# Patient Record
Sex: Female | Born: 1985 | Hispanic: Yes | Marital: Single | Smoking: Never smoker
Health system: Southern US, Community
[De-identification: ages and names within clinical notes are randomized; demographics above are authoritative.]

## PROBLEM LIST (undated history)

## (undated) ENCOUNTER — Inpatient Hospital Stay (HOSPITAL_COMMUNITY): Payer: Self-pay

## (undated) DIAGNOSIS — Z789 Other specified health status: Secondary | ICD-10-CM

## (undated) HISTORY — PX: OTHER SURGICAL HISTORY: SHX169

## (undated) HISTORY — DX: Other specified health status: Z78.9

---

## 2017-10-22 LAB — HM PAP SMEAR: HM Pap smear: NEGATIVE

## 2018-10-27 ENCOUNTER — Ambulatory Visit (LOCAL_COMMUNITY_HEALTH_CENTER): Payer: Self-pay | Admitting: Nurse Practitioner

## 2018-10-27 ENCOUNTER — Other Ambulatory Visit: Payer: Self-pay

## 2018-10-27 VITALS — BP 119/80 | Ht 59.0 in | Wt 157.4 lb

## 2018-10-27 DIAGNOSIS — Z3009 Encounter for other general counseling and advice on contraception: Secondary | ICD-10-CM

## 2018-10-27 MED ORDER — CRYSELLE-28 0.3-30 MG-MCG PO TABS: 1.0000 | ORAL_TABLET | Freq: Every day | ORAL | 11 refills | Status: DC

## 2018-10-27 NOTE — Progress Notes (Signed)
Patient ID: Maureen Johnston, female   DOB: 11-17-1985, 33 y.o.   MRN: [id number]  Client into clinic requesting oral contraceptive pills for BCM. Denies any significant changes in past medical history over past year. Denies any additional significant medical concerns at this time.  Other This is a new problem. Nothing aggravates the symptoms.    Review of Systems  All other systems reviewed and are negative.   Physical Exam  Constitutional: She is oriented to person, place, and time and well-developed, well-nourished, and in no distress. Vital signs are normal.  HENT:  Head: Normocephalic.  Neck: Normal range of motion.  Cardiovascular: Normal rate and regular rhythm.  Pulmonary/Chest: Effort normal and breath sounds normal.  Genitourinary:    Genitourinary Comments: Deferred until later date   Musculoskeletal: Normal range of motion.  Lymphadenopathy:    She has no cervical adenopathy.  Neurological: She is alert and oriented to person, place, and time.  Skin: Skin is warm, dry and intact.  Psychiatric: Affect normal.  Nursing note and vitals reviewed.     Client into clinic desiring to change BCM to OCP's  Last yearly physical exam 2019 - denies any significant medical changes in last year  LMP - 10/18/2018 - normal Last sex 09/29/2018 with condoms  Discussed with client how to properly take OCP's and what to do if pills are missed. Please give Cryselle - take 1 tab po q daly at the same time, #6, RF#7  Advised client to RTC in  6 month for re evaluation/yearly physical exam.  Client  verbalizes understanding and is in agreement with plan of care

## 2018-10-27 NOTE — Progress Notes (Signed)
Desires to change from Depo (last received 06/03/2018) to ocps. Per verbal order of Jerline Pain FNP-BC, dispense Cryselle #6 packs with refill of #7 packs. Take one tablet QD at same time QD. [facility] consent signed and client counseled in how to take ocps.

## 2018-10-29 ENCOUNTER — Encounter: Payer: Self-pay | Admitting: Nurse Practitioner

## 2018-11-03 ENCOUNTER — Encounter: Payer: Self-pay | Admitting: Nurse Practitioner

## 2018-11-03 NOTE — Patient Instructions (Signed)
The current method of family planning is OCP (estrogen/progesterone).

## 2019-10-20 ENCOUNTER — Ambulatory Visit (LOCAL_COMMUNITY_HEALTH_CENTER): Payer: Self-pay | Admitting: Family Medicine

## 2019-10-20 ENCOUNTER — Encounter: Payer: Self-pay | Admitting: Family Medicine

## 2019-10-20 ENCOUNTER — Other Ambulatory Visit: Payer: Self-pay

## 2019-10-20 VITALS — BP 118/82 | Ht <= 58 in | Wt 149.0 lb

## 2019-10-20 DIAGNOSIS — Z3200 Encounter for pregnancy test, result unknown: Secondary | ICD-10-CM

## 2019-10-20 DIAGNOSIS — F32A Depression, unspecified: Secondary | ICD-10-CM

## 2019-10-20 DIAGNOSIS — Z3009 Encounter for other general counseling and advice on contraception: Secondary | ICD-10-CM

## 2019-10-20 DIAGNOSIS — F329 Major depressive disorder, single episode, unspecified: Secondary | ICD-10-CM

## 2019-10-20 LAB — PREGNANCY, URINE: Preg Test, Ur: NEGATIVE

## 2019-10-20 MED ORDER — THERA VITAL M PO TABS: 1.0000 | ORAL_TABLET | Freq: Every day | ORAL | 0 refills | Status: DC

## 2019-10-20 NOTE — Progress Notes (Signed)
Pt here for physical. Pt reports she is not interested in birth control today. Pt reports last period was 09/03/2019, but it was lighter than normal with brown spotting. Larene Pickett, FNP made aware of pt's PHQ9 score today.

## 2019-10-20 NOTE — Progress Notes (Signed)
UPT is negative today. Pt reports she plans to use condom for birth control. Pt aware that if she changes her mind and desires another type of BCM that she can give [place] a call and RTC at any time. Pt states understanding. Pt received Kathreen Cosier, LCSW and cardinal cards with contact info per pt request and provider order. PCP list given to pt per provider order. Pt received MVI's per pt request. Counseled pt per provider orders and pt states understanding. Provider orders completed.

## 2019-10-20 NOTE — Progress Notes (Signed)
Family Planning Visit- Repeat Yearly Visit  Subjective:  Maureen Johnston is a 34 y.o. [phone]  being seen today for an well woman visit and to discuss family planning options.    She is currently using condoms sometimes for pregnancy prevention. Patient reports she does not if she or her partner wants a pregnancy in the next year. Patient  does not have a problem list on file.  Chief Complaint  Patient presents with  . Gynecologic Exam    Physical    Patient reports She is here for her annual exam.  States that she had a miscarriage 05/2019.  States that she missed her period in April and June but had a normal period 5/52021.  Last unprotected sex was 10/09/2019.  Client is concerned about pregnancy and would like to have a PT.  States that she and husband use condoms sometimes.  Client states that he is gone a lot with work and doesn't feel she needs birth control every day.   Client's PHQ 9 was 23- states that she is depressed because she and husband had to leave their children in [place] to live with family 3 years ago.  She would be interested in a referral to counseling.  Patient denies other concerns.  See flowsheet for other program required questions.   Body mass index is 31.14 kg/m. - Patient is eligible for diabetes screening based on BMI and age >38?  not applicable VF6E ordered? not applicable  Patient reports 1 of partners in last year. Desires STI screening?  No   Has patient been screened once for HCV in the past?  No  No results found for: HCVAB  Does the patient have current of drug use, have a partner with drug use, and/or has been incarcerated since last result? No  If yes-- Screen for HCV through [facility]   Does the patient meet criteria for HBV testing? No  Criteria:  -Household, sexual or needle sharing contact with HBV -History of drug use -HIV positive -Those with known Hep C   Health Maintenance Due  Topic Date Due  . Hepatitis C Screening   Never done  . COVID-19 Vaccine (1) Never done  . HIV Screening  Never done  . TETANUS/TDAP  Never done    Review of Systems  Constitutional:       Weight gain-works at restaurant.  Doesn't have time to exercise States that the heat makes her feel weak  HENT: Negative.   Eyes: Negative.   Respiratory: Negative.   Cardiovascular: Negative.   Gastrointestinal: Positive for nausea and vomiting.       Nausea with HA or has this randomly Vomiting with headaches.  Genitourinary: Negative.   Musculoskeletal: Negative.  Negative for joint pain.  Skin: Negative.   Neurological: Positive for dizziness and headaches.       Dizziness- occas. 1-2 times/week.  Not sure what causes this.   Headache- states that her Has can cause her to have N/V and sometimes she sees light.   Psychiatric/Behavioral: Positive for depression.    The following portions of the patient's history were reviewed and updated as appropriate: allergies, current medications, past family history, past medical history, past social history, past surgical history and problem list. Problem list updated.  Objective:   Vitals:   10/20/19 1117  BP: 118/82  Weight: 149 lb (67.6 kg)  Height: 4\' 10"  (1.473 m)    Physical Exam Constitutional:      Appearance: Normal appearance.  Cardiovascular:     Rate and Rhythm: Normal rate.     Heart sounds: Normal heart sounds.  Pulmonary:     Effort: Pulmonary effort is normal.     Breath sounds: Normal breath sounds.  Genitourinary:    Comments: Pelvic exam not indicated Musculoskeletal:     Cervical back: Neck supple.  Skin:    General: Skin is warm.  Neurological:     Mental Status: She is alert and oriented to person, place, and time.    Assessment and Plan:  Maureen Johnston is a 34 y.o. female [phone] presenting to the [facility] Department for an yearly well woman exam/family planning visit  Contraception counseling: Reviewed all forms of birth control  options in the tiered based approach. available including abstinence; over the counter/barrier methods; hormonal contraceptive medication including pill, patch, ring, injection,contraceptive implant, ECP; hormonal and nonhormonal IUDs; permanent sterilization options including vasectomy and the various tubal sterilization modalities. Risks, benefits, and typical effectiveness rates were reviewed.  Questions were answered.  Written information was also given to the patient to review.  Patient desires condoms, this was prescribed for patient. She will follow up in  1 year for surveillance.  She was told to call with any further questions, or with any concerns about this method of contraception.  Emphasized use of condoms 100% of the time for STI prevention.  Patient was not a ECP candidateECP    1. Possible pregnancy  - Pregnancy, urine- negative result  2. Family planning  - Multiple Vitamins-Minerals (MULTIVITAMIN) tablet; Take 1 tablet by mouth daily.  Dispense: 100 tablet; Refill: 0 Co to use condoms consistently.  3. Depression Refer to Kathreen Cosier for counseling Given Coral Spikes card and Cardinal card.    Return for annual and PRN.  No future appointments.  Maureen Pickett, FNP

## 2019-12-08 ENCOUNTER — Ambulatory Visit: Payer: Self-pay | Attending: Internal Medicine

## 2019-12-08 DIAGNOSIS — Z23 Encounter for immunization: Secondary | ICD-10-CM

## 2019-12-08 NOTE — Progress Notes (Signed)
   Covid-19 Vaccination Clinic  Name:  Maureen Johnston    MRN: [id number] DOB: 24-Nov-1985  12/08/2019  [name] was observed post Covid-19 immunization for 15 minutes without incident. She was provided with Vaccine Information Sheet and instruction to access the V-Safe system.   [name] was instructed to call 911 with any severe reactions post vaccine: Marland Kitchen Difficulty breathing  . Swelling of face and throat  . A fast heartbeat  . A bad rash all over body  . Dizziness and weakness   Immunizations Administered    Name Date Dose VIS Date Route   Pfizer COVID-19 Vaccine 12/08/2019  1:59 PM 0.3 mL 06/24/2018 Intramuscular   Manufacturer: [employer], [employer]   Lot: [id number]   NDC: [address]-3

## 2019-12-29 ENCOUNTER — Ambulatory Visit: Payer: Self-pay | Attending: Internal Medicine

## 2019-12-29 DIAGNOSIS — Z23 Encounter for immunization: Secondary | ICD-10-CM

## 2019-12-29 NOTE — Progress Notes (Signed)
   Covid-19 Vaccination Clinic  Name:  Maureen Johnston    MRN: [id number] DOB: 1985-08-24  12/29/2019  Ms. Maureen Johnston was observed post Covid-19 immunization for 15 minutes without incident. She was provided with Vaccine Information Sheet and instruction to access the V-Safe system.   Ms. Maureen Johnston was instructed to call 911 with any severe reactions post vaccine: Marland Kitchen Difficulty breathing  . Swelling of face and throat  . A fast heartbeat  . A bad rash all over body  . Dizziness and weakness   Immunizations Administered    Name Date Dose VIS Date Route   Pfizer COVID-19 Vaccine 12/29/2019 11:59 AM 0.3 mL 06/24/2018 Intramuscular   Manufacturer: [employer], [employer]   Lot: [id number]   NDC: [address]-1

## 2020-04-08 ENCOUNTER — Ambulatory Visit: Payer: Self-pay

## 2020-07-05 ENCOUNTER — Ambulatory Visit (LOCAL_COMMUNITY_HEALTH_CENTER): Payer: Self-pay

## 2020-07-05 ENCOUNTER — Other Ambulatory Visit: Payer: Self-pay

## 2020-07-05 VITALS — BP 116/73 | Ht <= 58 in | Wt 163.0 lb

## 2020-07-05 DIAGNOSIS — Z3201 Encounter for pregnancy test, result positive: Secondary | ICD-10-CM

## 2020-07-05 LAB — PREGNANCY, URINE: Preg Test, Ur: POSITIVE — AB

## 2020-07-05 MED ORDER — PRENATAL 27-0.8 MG PO TABS: 1.0000 | ORAL_TABLET | Freq: Every day | ORAL | 0 refills | Status: AC

## 2020-07-05 NOTE — Progress Notes (Signed)
UPT positive. Plans prenatal care at [facility]. To clerk for preadmit. Lang line, interpreter. Jerel Shepherd, RN

## 2020-07-15 ENCOUNTER — Ambulatory Visit: Payer: Medicaid Other | Admitting: Family Medicine

## 2020-07-15 ENCOUNTER — Telehealth: Payer: Self-pay

## 2020-07-15 ENCOUNTER — Telehealth: Payer: Self-pay | Admitting: Family Medicine

## 2020-07-15 ENCOUNTER — Encounter: Payer: Self-pay | Admitting: Family Medicine

## 2020-07-15 ENCOUNTER — Other Ambulatory Visit: Payer: Self-pay

## 2020-07-15 VITALS — BP 115/83 | HR 62 | Temp 98.1°F | Wt 164.4 lb

## 2020-07-15 DIAGNOSIS — O09519 Supervision of elderly primigravida, unspecified trimester: Secondary | ICD-10-CM | POA: Insufficient documentation

## 2020-07-15 DIAGNOSIS — O99210 Obesity complicating pregnancy, unspecified trimester: Secondary | ICD-10-CM

## 2020-07-15 DIAGNOSIS — O0990 Supervision of high risk pregnancy, unspecified, unspecified trimester: Secondary | ICD-10-CM | POA: Diagnosis not present

## 2020-07-15 DIAGNOSIS — O99211 Obesity complicating pregnancy, first trimester: Secondary | ICD-10-CM

## 2020-07-15 DIAGNOSIS — Z6834 Body mass index (BMI) 34.0-34.9, adult: Secondary | ICD-10-CM | POA: Insufficient documentation

## 2020-07-15 DIAGNOSIS — O09511 Supervision of elderly primigravida, first trimester: Secondary | ICD-10-CM | POA: Diagnosis not present

## 2020-07-15 DIAGNOSIS — Z23 Encounter for immunization: Secondary | ICD-10-CM

## 2020-07-15 LAB — HEMOGLOBIN, FINGERSTICK: Hemoglobin: 11 g/dL — ABNORMAL LOW (ref 11.1–15.9)

## 2020-07-15 LAB — URINALYSIS
Bilirubin, UA: NEGATIVE
Ketones, UA: NEGATIVE
Nitrite, UA: NEGATIVE
Protein,UA: NEGATIVE
RBC, UA: NEGATIVE
Specific Gravity, UA: 1.02 (ref 1.005–1.030)
Urobilinogen, Ur: 0.2 mg/dL (ref 0.2–1.0)
pH, UA: 6.5 (ref 5.0–7.5)

## 2020-07-15 MED ORDER — ASPIRIN EC 81 MG PO TBEC: 81.0000 mg | DELAYED_RELEASE_TABLET | Freq: Every day | ORAL | 2 refills | Status: DC

## 2020-07-15 NOTE — Progress Notes (Signed)
Flu vaccine given, left deltoid, tolerated well, VIS given. NCIR in TEFL teacher. Patient counseled to go to [facility] and walked to covid vaccine clinic today by M. Yemen. [facility] U/S referral faxed with confirmation.Burt Knack, RN

## 2020-07-15 NOTE — Telephone Encounter (Signed)
TC to patient to change appointment from 4 weeks to 2 weeks out. Patient phone not in service. TC to emergency contact Exequiel, FOB. FOB speaks [place]. FOB asked to have patient call [facility] and ask for maternity nurse. Patient has an appointment but needs an appointment in 2 weeks instead of 4 weeks, per provider. FOB states patient's phone broke and she got a new phone, number [phone], and patient is trying to get her old number back (so new phone not added to patient demographics at this time). TC to number given by FOB and left message with number to call. Also, FOB states he will call patient and tell them to call [facility] today before 5pm..Siyon Linck Babs Sciara, RN

## 2020-07-15 NOTE — Telephone Encounter (Signed)
TC to patient to change MH RV appointment. Appointment rescheduled for 07/29/2020. Interpreter M. Yemen.Marland KitchenMarland KitchenBurt Knack, RN

## 2020-07-15 NOTE — Progress Notes (Addendum)
Patient here for new OB at about 12 6/7. Patient lives with friends, her 3 children live in [place] with patient mother. Phone, emergency contact and pharmacy verified. High BMI labs today per unsure pre-pregnancy weight, and consult with provider.Marland KitchenMarland KitchenMarland KitchenBurt Knack, RN

## 2020-07-15 NOTE — Progress Notes (Signed)
[facility] HEALTH DEPT [facility] [address] [place] [address] [phone]  INITIAL PRENATAL VISIT NOTE  Subjective:  Maureen Johnston is a 35 y.o. [id number] at [redacted]w[redacted]d being seen today to start prenatal care at the [facility] Department.  She is currently monitored for the following issues for this high-risk pregnancy and has Supervision of high risk pregnancy, antepartum; Maternal obesity, antepartum; and Advanced maternal age, primigravida, antepartum on their problem list.  Patient reports no complaints.  Contractions: Not present. Vag. Bleeding: None.  Movement: Absent. Denies leaking of fluid.   Indications for ASA therapy (per uptodate) One of the following: Previous pregnancy with preeclampsia, especially early onset and with an adverse outcome No Multifetal gestation No Chronic hypertension No Type 1 or 2 diabetes mellitus No Chronic kidney disease No Autoimmune disease (antiphospholipid syndrome, systemic lupus erythematosus) No  Two or more of the following: Nulliparity No Obesity (body mass index >30 kg/m2) Yes Family history of preeclampsia in mother or sister No Age ?35 years Yes Sociodemographic characteristics (African American race, low socioeconomic level) No Personal risk factors (eg, previous pregnancy with low birth weight or small for gestational age infant, previous adverse pregnancy outcome [eg, stillbirth], interval >10 years between pregnancies) No   The following portions of the patient's history were reviewed and updated as appropriate: allergies, current medications, past family history, past medical history, past social history, past surgical history and problem list. Problem list updated.  Objective:   Vitals:   07/15/20 0849  BP: 115/83  Pulse: 62  Temp: 98.1 F (36.7 C)  Weight: 164 lb 6.4 oz (74.6 kg)    Fetal Status: Fetal Heart Rate (bpm): not heard Fundal Height: 12 cm  Movement: Absent  Presentation: Undeterminable   Physical Exam Vitals and nursing note reviewed.  Constitutional:      General: She is not in acute distress.    Appearance: Normal appearance. She is well-developed.  HENT:     Head: Normocephalic and atraumatic.     Right Ear: External ear normal.     Left Ear: External ear normal.     Nose: Nose normal. No congestion or rhinorrhea.     Mouth/Throat:     Lips: Pink.     Mouth: Mucous membranes are moist.     Dentition: Normal dentition. No dental caries.     Pharynx: Oropharynx is clear. Uvula midline.     Comments: Dentition: good, last dental 1 year ago in [place]  Eyes:     General: No scleral icterus.    Conjunctiva/sclera: Conjunctivae normal.  Neck:     Thyroid: No thyroid mass or thyromegaly.  Cardiovascular:     Rate and Rhythm: Normal rate.     Pulses: Normal pulses.     Comments: Extremities are warm and well perfused Pulmonary:     Effort: Pulmonary effort is normal.     Breath sounds: Normal breath sounds.  Chest:     Chest wall: No mass.  Breasts:     Tanner Score is 5. Breasts are symmetrical.     Right: Normal. No mass, nipple discharge, skin change or axillary adenopathy.     Left: Normal. No mass, nipple discharge, skin change or axillary adenopathy.    Abdominal:     General: Abdomen is flat.     Palpations: Abdomen is soft.     Tenderness: There is no abdominal tenderness. Rebound: Obese.     Comments: Gravid   Genitourinary:  General: Normal vulva.     Exam position: Lithotomy position.     Pubic Area: No rash.      Labia:        Right: No rash.        Left: No rash.      Vagina: Normal. No vaginal discharge.     Cervix: No cervical motion tenderness or friability.     Uterus: Normal. Enlarged (Gravid 12wk). Not tender.      Adnexa: Right adnexa normal and left adnexa normal.     Rectum: Normal. No external hemorrhoid.  Musculoskeletal:     Right lower leg: No edema.     Left lower leg:  No edema.  Lymphadenopathy:     Cervical: No cervical adenopathy.     Upper Body:     Right upper body: No axillary adenopathy.     Left upper body: No axillary adenopathy.  Skin:    General: Skin is warm.     Capillary Refill: Capillary refill takes less than 2 seconds.  Neurological:     Mental Status: She is alert.     Assessment and Plan:  Pregnancy: [id number] at [redacted]w[redacted]d  1. Supervision of high risk pregnancy, antepartum Reviewed genetic screening options- Reviewed increased risk due to AMA. Declined Genetic counseling. Declined FIRST.  MFM [place] ordered. Re offer Quad at appropriate time.  Reviewed cadence of care Unable to hear FHT, patient with sure LMP and no warning signs. Offer interval FHR check and patient would like this.  - Chlamydia/GC NAA, Confirmation - Comprehensive metabolic panel - Hgb Fractionation Cascade - Glucose tolerance, 1 hour - HCV Ab w Reflex to Quant PCR - HIV-1/HIV-2 Qualitative RNA - Hgb A1c w/o eAG - CBC/D/Plt+RPR+Rh+ABO+Rub Ab... - QuantiFERON-TB Gold Plus - TSH - Urine Culture - Urinalysis (Urine Dip) - Hemoglobin, venipuncture - [place] MFM OB COMP + 14 WK; Future - IGP, Aptima HPV - Amb ref to Medical Nutrition Therapy-MNT  2. Maternal obesity, antepartum Recommended 11-15 lb weight gain HA1C, 1 gtt, TSH, CMP Reviewed ASA for primary PEC prevention and gave handout. Patient agrees to start and placed on med list. Reviewed taking from 12-36 wk MNT referral placed today   Discussed overview of care and coordination with inpatient delivery practices including [facility], [facility], [facility] and [facility].    Preterm labor symptoms and general obstetric precautions including but not limited to vaginal bleeding, contractions, leaking of fluid and fetal movement were reviewed in detail with the patient.  Please refer to After Visit Summary for other counseling recommendations.   Return in about 2 weeks (around 07/29/2020) for Routine prenatal  care, FHR check.  Future Appointments  Date Time Provider Department Center  08/09/2020  8:40 AM [facility] PROVIDER [facility] None    Federico Flake, MD

## 2020-07-15 NOTE — Telephone Encounter (Signed)
Pt said that she was expecting a phone call about her ultrasound but she dropped her phone and had to get a new one. The phone number changed and her file has been updated. Please call the new number.

## 2020-07-15 NOTE — Telephone Encounter (Signed)
TC to patient to reschedule next MH RV. Scheduled for 07/29/2020. Patient informed that we don't have her U/S appointment yet and will call her when it is scheduled. Patient states understanding. Interpreter M. Yemen.Burt Knack, RN

## 2020-07-16 LAB — CBC/D/PLT+RPR+RH+ABO+RUB AB...
Antibody Screen: NEGATIVE
Basophils Absolute: 0 10*3/uL (ref 0.0–0.2)
Basos: 1 %
EOS (ABSOLUTE): 0.1 10*3/uL (ref 0.0–0.4)
Eos: 2 %
Hematocrit: 34.7 % (ref 34.0–46.6)
Hemoglobin: 11 g/dL — ABNORMAL LOW (ref 11.1–15.9)
Hepatitis B Surface Ag: NEGATIVE
Immature Grans (Abs): 0 10*3/uL (ref 0.0–0.1)
Immature Granulocytes: 1 %
Lymphocytes Absolute: 1.4 10*3/uL (ref 0.7–3.1)
Lymphs: 31 %
MCH: 24.8 pg — ABNORMAL LOW (ref 26.6–33.0)
MCHC: 31.7 g/dL (ref 31.5–35.7)
MCV: 78 fL — ABNORMAL LOW (ref 79–97)
Monocytes Absolute: 0.3 10*3/uL (ref 0.1–0.9)
Monocytes: 7 %
Neutrophils Absolute: 2.7 10*3/uL (ref 1.4–7.0)
Neutrophils: 58 %
Platelets: 273 10*3/uL (ref 150–450)
RBC: 4.43 x10E6/uL (ref 3.77–5.28)
RDW: 19.4 % — ABNORMAL HIGH (ref 11.7–15.4)
RPR Ser Ql: NONREACTIVE
Rh Factor: POSITIVE
Rubella Antibodies, IGG: 1.86 index (ref >0.99)
Varicella zoster IgG: 673 index (ref >165)
WBC: 4.6 10*3/uL (ref 3.4–10.8)

## 2020-07-16 LAB — HCV AB W REFLEX TO QUANT PCR: HCV Ab: <0.1 s/co ratio (ref 0.0–0.9)

## 2020-07-16 LAB — HCV INTERPRETATION

## 2020-07-17 LAB — CHLAMYDIA/GC NAA, CONFIRMATION
Chlamydia trachomatis, NAA: NEGATIVE
Neisseria gonorrhoeae, NAA: NEGATIVE

## 2020-07-18 ENCOUNTER — Telehealth: Payer: Self-pay

## 2020-07-18 LAB — HGB FRACTIONATION CASCADE
Hgb A2: 2.3 % (ref 1.8–3.2)
Hgb A: 97.7 % (ref 96.4–98.8)
Hgb F: 0 % (ref 0.0–2.0)
Hgb S: 0 %

## 2020-07-18 LAB — QUANTIFERON-TB GOLD PLUS
QuantiFERON Mitogen Value: >10 IU/mL
QuantiFERON Nil Value: 0.1 IU/mL
QuantiFERON TB1 Ag Value: 0.35 IU/mL
QuantiFERON TB2 Ag Value: 0.31 IU/mL
QuantiFERON-TB Gold Plus: NEGATIVE

## 2020-07-18 LAB — TSH: TSH: 2.17 u[IU]/mL (ref 0.450–4.500)

## 2020-07-18 LAB — HGB A1C W/O EAG: Hgb A1c MFr Bld: 5.4 % (ref 4.8–5.6)

## 2020-07-18 LAB — HIV-1/HIV-2 QUALITATIVE RNA
HIV-1 RNA, Qualitative: NONREACTIVE
HIV-2 RNA, Qualitative: NONREACTIVE

## 2020-07-18 LAB — COMPREHENSIVE METABOLIC PANEL
ALT: 18 IU/L (ref 0–32)
AST: 20 IU/L (ref 0–40)
Albumin/Globulin Ratio: 1.5 (ref 1.2–2.2)
Albumin: 4.3 g/dL (ref 3.8–4.8)
Alkaline Phosphatase: 72 IU/L (ref 44–121)
BUN/Creatinine Ratio: 13 (ref 9–23)
BUN: 6 mg/dL (ref 6–20)
Bilirubin Total: 0.2 mg/dL (ref 0.0–1.2)
CO2: 20 mmol/L (ref 20–29)
Calcium: 8.9 mg/dL (ref 8.7–10.2)
Chloride: 102 mmol/L (ref 96–106)
Creatinine, Ser: 0.48 mg/dL — ABNORMAL LOW (ref 0.57–1.00)
Globulin, Total: 2.8 g/dL (ref 1.5–4.5)
Glucose: 119 mg/dL — ABNORMAL HIGH (ref 65–99)
Potassium: 4.1 mmol/L (ref 3.5–5.2)
Sodium: 139 mmol/L (ref 134–144)
Total Protein: 7.1 g/dL (ref 6.0–8.5)
eGFR: 126 mL/min/{1.73_m2} (ref >59)

## 2020-07-18 LAB — GLUCOSE, 1 HOUR GESTATIONAL: Gestational Diabetes Screen: 105 mg/dL (ref 65–139)

## 2020-07-18 LAB — URINE CULTURE

## 2020-07-18 NOTE — Telephone Encounter (Signed)
Call from Marylu Lund [facility] MFM scheduler) and client's appt scheduled for 08/25/2020 at 1000. Call to client with appt and verbal directions to facility provided. Roddie Mc Yemen interpreted during phone call. Jossie Ng, RN

## 2020-07-18 NOTE — Telephone Encounter (Signed)
Call to scheduler to ascertain if appt scheduled  (referral faxed with confirmation on 07/15/2020). Left message requesting call back and number to call provided. Jossie Ng, RN

## 2020-07-19 ENCOUNTER — Telehealth: Payer: Self-pay

## 2020-07-19 LAB — IGP, APTIMA HPV
HPV Aptima: NEGATIVE
PAP Smear Comment: 0

## 2020-07-19 NOTE — Telephone Encounter (Signed)
Call from Marigene Ehlers, chartroom clerk, with client on phone requesting clarification of next [facility] RV appt and [place] appt. Appts provided to client and counseled on purpose of each. Marigene Ehlers interpreted during call. At end of call, client verbalized understanding and without further questions. Jossie Ng, RN

## 2020-07-27 ENCOUNTER — Telehealth: Payer: Self-pay | Admitting: Dietician

## 2020-07-29 ENCOUNTER — Encounter: Payer: Self-pay | Admitting: Advanced Practice Midwife

## 2020-07-29 ENCOUNTER — Ambulatory Visit: Payer: Medicaid Other | Admitting: Advanced Practice Midwife

## 2020-07-29 ENCOUNTER — Other Ambulatory Visit: Payer: Self-pay

## 2020-07-29 VITALS — BP 110/69 | HR 68 | Temp 97.9°F | Wt 165.0 lb

## 2020-07-29 DIAGNOSIS — O0990 Supervision of high risk pregnancy, unspecified, unspecified trimester: Secondary | ICD-10-CM

## 2020-07-29 DIAGNOSIS — O99210 Obesity complicating pregnancy, unspecified trimester: Secondary | ICD-10-CM

## 2020-07-29 DIAGNOSIS — O09512 Supervision of elderly primigravida, second trimester: Secondary | ICD-10-CM | POA: Diagnosis not present

## 2020-07-29 DIAGNOSIS — O09519 Supervision of elderly primigravida, unspecified trimester: Secondary | ICD-10-CM

## 2020-07-29 NOTE — Progress Notes (Signed)
Gave patient reminder card and directions for appointment on 08/25/20 at 1000 for [facility].

## 2020-07-29 NOTE — Progress Notes (Signed)
   PRENATAL VISIT NOTE  Subjective:  Maureen Johnston is a 35 y.o. [phone] at [redacted]w[redacted]d being seen today for ongoing prenatal care.  She is currently monitored for the following issues for this high-risk pregnancy and has Supervision of high risk pregnancy, antepartum; Maternal obesity, antepartum; and Advanced maternal age, primigravida, antepartum on their problem list.  Patient reports no complaints.  Contractions: Not present. Vag. Bleeding: None.  Movement: Absent. Denies leaking of fluid/ROM.   The following portions of the patient's history were reviewed and updated as appropriate: allergies, current medications, past family history, past medical history, past social history, past surgical history and problem list. Problem list updated.  Objective:   Vitals:   07/29/20 1030  BP: 110/69  Pulse: 68  Temp: 97.9 F (36.6 C)  Weight: 165 lb (74.8 kg)    Fetal Status: Fetal Heart Rate (bpm): 160 Fundal Height: 14 cm Movement: Absent     General:  Alert, oriented and cooperative. Patient is in no acute distress.  Skin: Skin is warm and dry. No rash noted.   Cardiovascular: Normal heart rate noted  Respiratory: Normal respiratory effort, no problems with respiration noted  Abdomen: Soft, gravid, appropriate for gestational age.  Pain/Pressure: Absent     Pelvic: Cervical exam deferred        Extremities: Normal range of motion.  Edema: None  Mental Status: Normal mood and affect. Normal behavior. Normal judgment and thought content.   Assessment and Plan:  Pregnancy: [id number] at [redacted]w[redacted]d  1. Supervision of high risk pregnancy, antepartum Needs CBC and Hgb at 28 wks Pt reminded of 08/25/20 anatomy u/s MFM Wants Quad screen at next apt   2. Maternal obesity, antepartum 30 lb (13.6 kg) Taking ASA 81 mg daily  3. Advanced maternal age, primigravida, antepartum 35 yo Declined genetic counseling Declined FIRST screen   Preterm labor symptoms and general obstetric precautions  including but not limited to vaginal bleeding, contractions, leaking of fluid and fetal movement were reviewed in detail with the patient. Please refer to After Visit Summary for other counseling recommendations.  No follow-ups on file.  Future Appointments  Date Time Provider Department Center  08/25/2020 10:00 AM [facility] US1 [facility]  08/25/2020 11:00 AM [facility] CONSULT RM [facility] None    Alberteen Spindle, CNM

## 2020-08-09 ENCOUNTER — Ambulatory Visit: Payer: Self-pay

## 2020-08-23 ENCOUNTER — Ambulatory Visit: Payer: Self-pay

## 2020-08-25 ENCOUNTER — Other Ambulatory Visit
Admission: RE | Admit: 2020-08-25 | Discharge: 2020-08-25 | Disposition: A | Discharge status: Home / Self Care | Payer: Self-pay | Source: Ambulatory Visit | Attending: Maternal & Fetal Medicine | Admitting: Maternal & Fetal Medicine

## 2020-08-25 ENCOUNTER — Other Ambulatory Visit: Payer: Self-pay

## 2020-08-25 ENCOUNTER — Ambulatory Visit (HOSPITAL_BASED_OUTPATIENT_CLINIC_OR_DEPARTMENT_OTHER): Payer: Self-pay

## 2020-08-25 ENCOUNTER — Ambulatory Visit: Payer: Self-pay | Attending: Maternal & Fetal Medicine

## 2020-08-25 VITALS — BP 118/78 | HR 67 | Temp 98.3°F | Wt 166.0 lb

## 2020-08-25 DIAGNOSIS — Z3A16 16 weeks gestation of pregnancy: Secondary | ICD-10-CM

## 2020-08-25 DIAGNOSIS — O0990 Supervision of high risk pregnancy, unspecified, unspecified trimester: Secondary | ICD-10-CM

## 2020-08-25 DIAGNOSIS — O09519 Supervision of elderly primigravida, unspecified trimester: Secondary | ICD-10-CM

## 2020-08-25 DIAGNOSIS — O99212 Obesity complicating pregnancy, second trimester: Secondary | ICD-10-CM | POA: Insufficient documentation

## 2020-08-25 DIAGNOSIS — O321XX0 Maternal care for breech presentation, not applicable or unspecified: Secondary | ICD-10-CM

## 2020-08-25 DIAGNOSIS — E669 Obesity, unspecified: Secondary | ICD-10-CM

## 2020-08-25 DIAGNOSIS — O09522 Supervision of elderly multigravida, second trimester: Secondary | ICD-10-CM | POA: Insufficient documentation

## 2020-08-25 DIAGNOSIS — Z3A15 15 weeks gestation of pregnancy: Secondary | ICD-10-CM

## 2020-08-25 DIAGNOSIS — O99210 Obesity complicating pregnancy, unspecified trimester: Secondary | ICD-10-CM

## 2020-08-25 DIAGNOSIS — Z3A00 Weeks of gestation of pregnancy not specified: Secondary | ICD-10-CM | POA: Insufficient documentation

## 2020-08-25 DIAGNOSIS — O09512 Supervision of elderly primigravida, second trimester: Secondary | ICD-10-CM

## 2020-08-25 IMAGING — US US MFM OB COMP +14 WKS
1 series · 13 of 28 positions shown · non-contrast
Comparison: none

[Series 1: us mfm ob comp +14 wks · 13 of 52 slices shown]
[im 2/52]
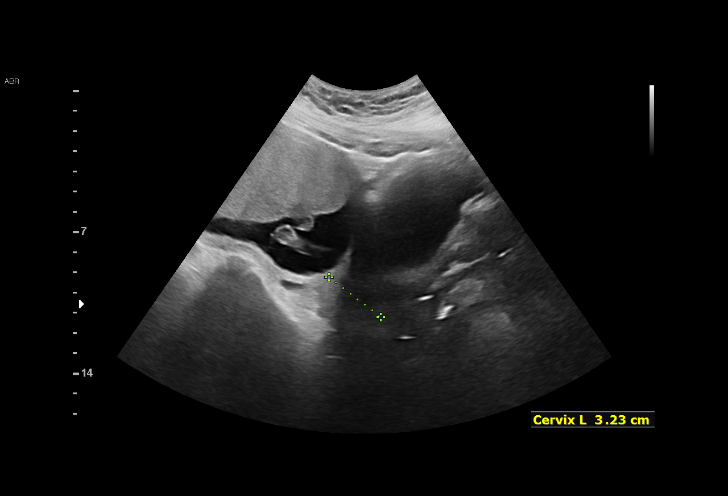
[im 6/52]
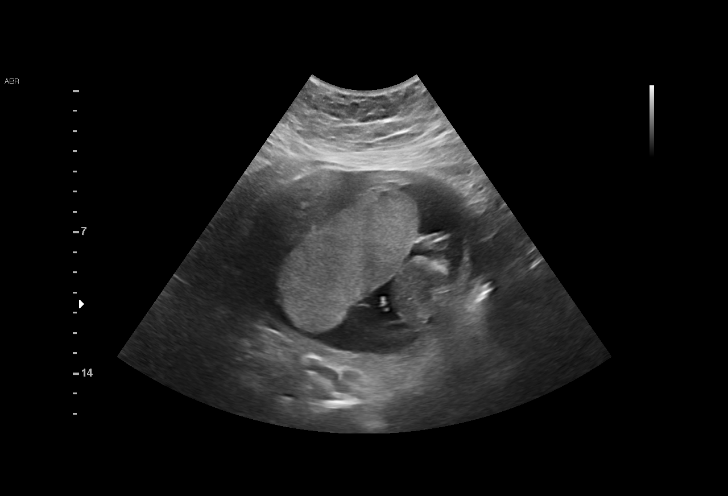
[im 10/52]
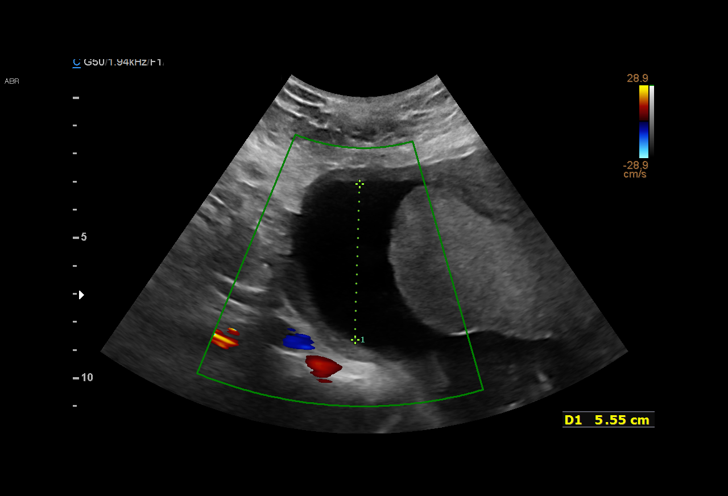
[im 14/52]
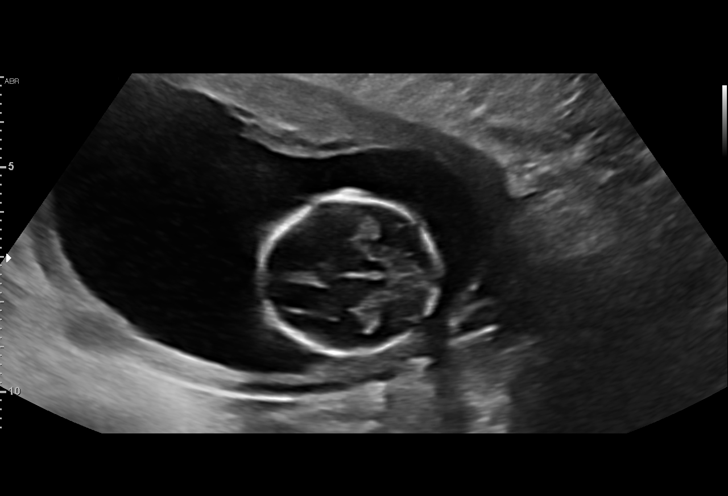
[im 18/52]
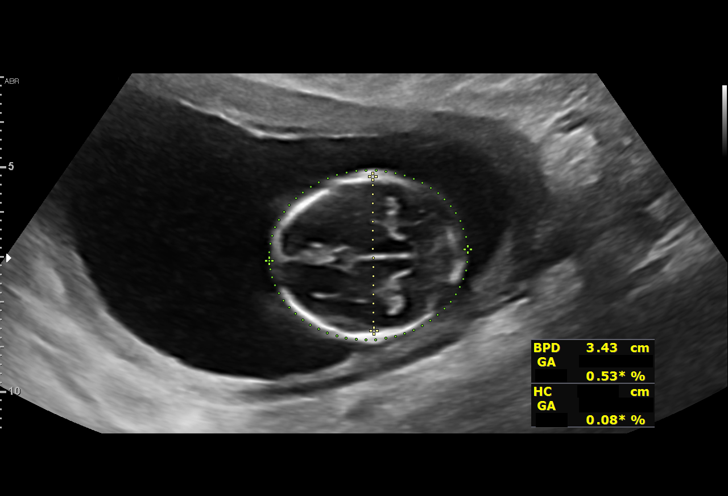
[im 21/52]
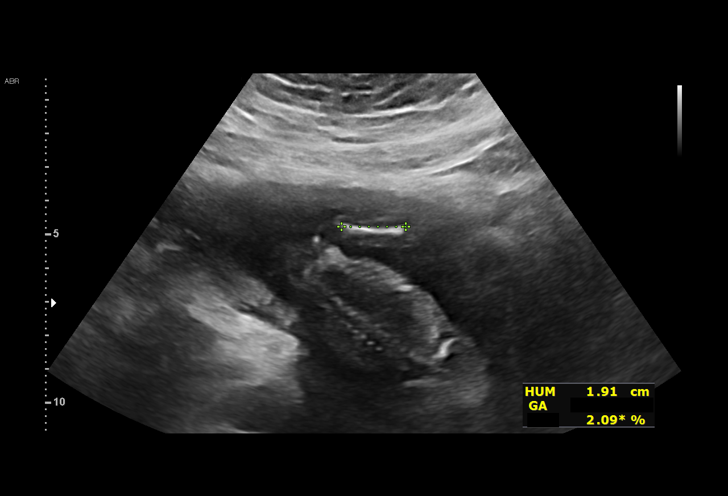
[im 27/52]
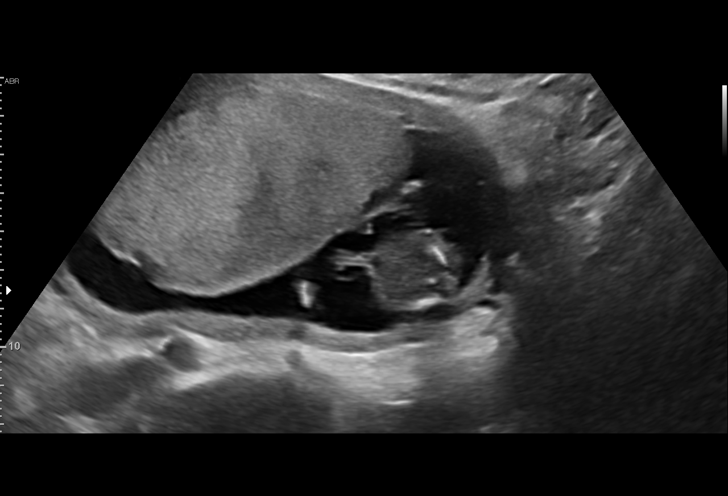
[im 31/52]
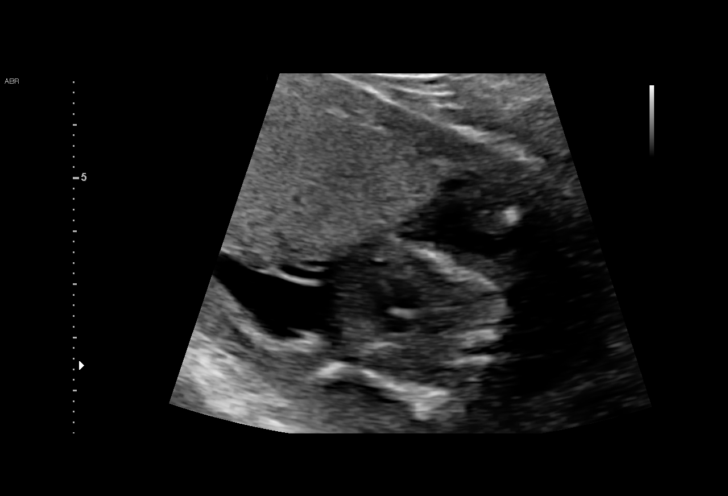
[im 35/52]
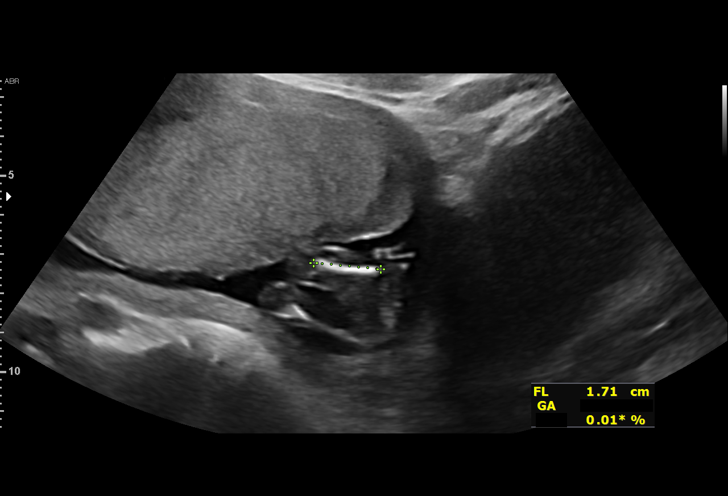
[im 38/52]
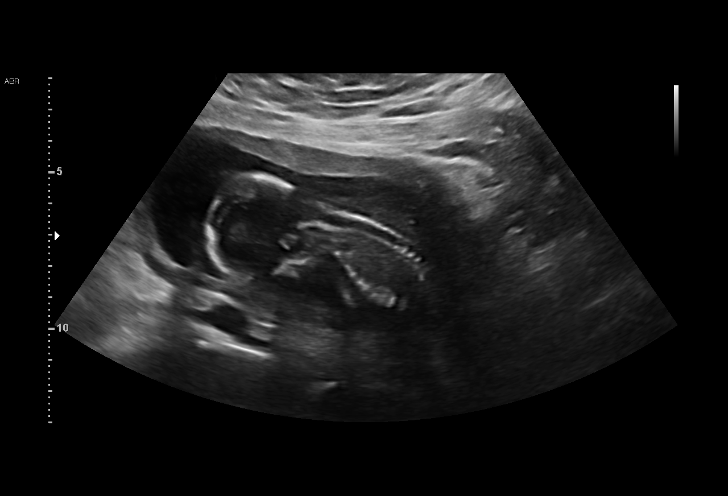
[im 42/52]
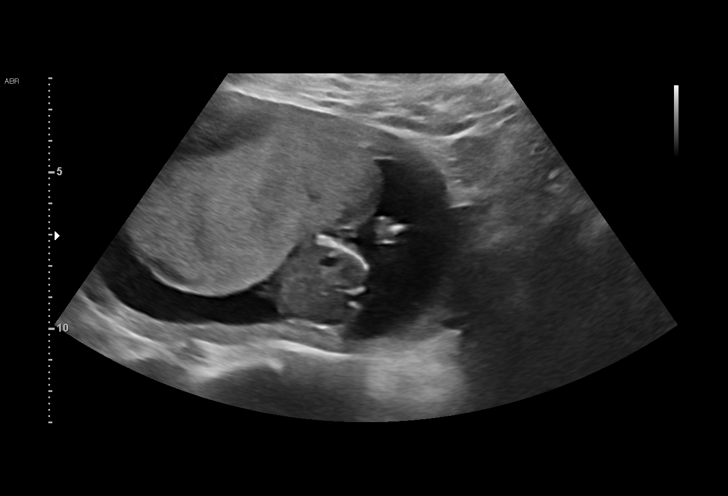
[im 46/52]
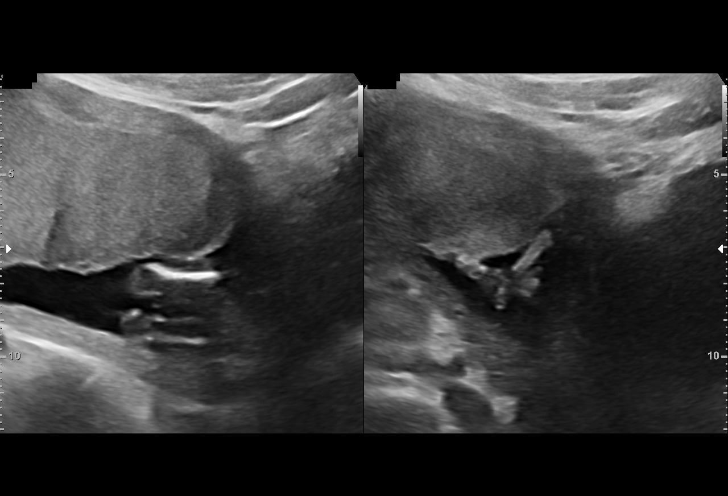
[im 50/52]
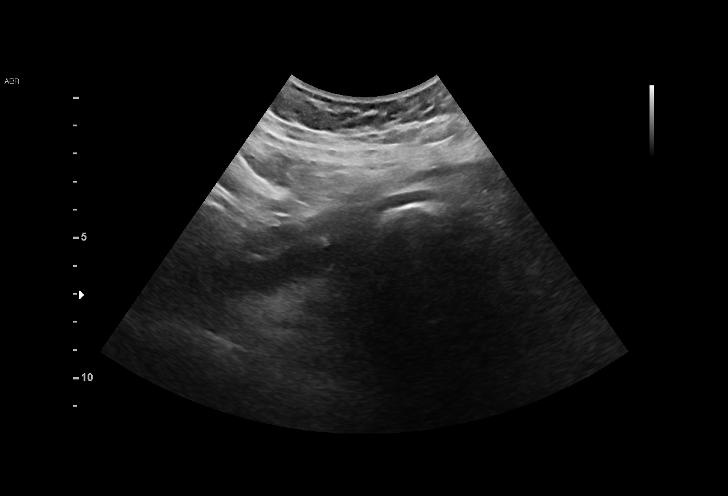

[13 of 28 positions shown; findings below may reference images not displayed]

1  US MFM OB COMP + 14 WK                76805.01    GURKA ZANBRANA

Indications

 16 weeks gestation of pregnancy
 Advanced maternal age multigravida 33+,
 second trimester
 Obesity complicating pregnancy
 Encounter for antenatal screening for
 malformations
 Encounter for uncertain dates
Fetal Evaluation

 Num Of Fetuses:         1
 Fetal Heart Rate(bpm):  157
 Cardiac Activity:       Observed
 Presentation:           Breech
 Placenta:               Anterior
 P. Cord Insertion:      Visualized, central

                             Largest Pocket(cm)

Biometry

 BPD:      34.3  mm     G. Age:  16w 4d         75  %    CI:        72.72   %    70 - 86
                                                         FL/HC:      13.4   %    13.3 -
 HC:      127.9  mm     G. Age:  16w 3d         64  %    HC/AC:      1.35        1.05 -
 AC:       94.9  mm     G. Age:  15w 4d         38  %    FL/BPD:     50.1   %
 FL:       17.2  mm     G. Age:  15w 1d         13  %    FL/AC:      18.1   %    20 - 24
 HUM:      18.9  mm     G. Age:  15w 4d         38  %

 Est. FW:     126  gm      0 lb 4 oz     14  %
Gestational Age

 LMP:           18w 5d        Date:  04/16/20                 EDD:   01/21/21
 U/S Today:     16w 0d                                        EDD:   02/09/21
 Best:          16w 0d     Det. By:  U/S (08/25/20)           EDD:   02/09/21
Anatomy

 Cranium:               Not well visualized    Aortic Arch:            Not well visualized
 Cavum:                 Not well visualized    Ductal Arch:            Not well visualized
 Ventricles:            Not well visualized    Diaphragm:              Not well visualized
 Choroid Plexus:        Not well visualized    Stomach:                Appears normal, left
                                                                       sided
 Cerebellum:            Not well visualized    Abdomen:                Appears normal
 Posterior Fossa:       Not well visualized    Abdominal Wall:         Not well visualized
 Nuchal Fold:           Not well visualized    Cord Vessels:           Appears normal (3
                                                                       vessel cord)
 Face:                  Orbits appear          Kidneys:                Not well visualized
                        normal
 Lips:                  Not well visualized    Bladder:                Appears normal
 Heart:                 Not well visualized    Spine:                  Not well visualized
 RVOT:                  Not well visualized    Upper Extremities:      Appears normal
 LVOT:                  Not well visualized    Lower Extremities:      Appears normal
Cervix Uterus Adnexa

 Cervix
 Length:           3.23  cm.

 Adnexa
 Appears WNL
Impression

 Single intrauterine pregnancy here for a detailed anatomy
 Normal anatomy with measurements are inconsistent with
 dates therefore we have adjusted her EDD to today's
 examination with a new EDD of 02/09/21
 There is good fetal movement and amniotic fluid volume
 Suboptimal views of the fetal anatomy were obtained
 secondary to fetal position and early gestation.

 Ms. Deny Gabriel is AMA and she opted to meet with our genetic
 counselor Blain Jumper, to discuss her genetic screening
 options. At the conclusion of their visit she opted for cell free
 DNA screening. please see note for details.

 In addition, I discussed with Ms. Deny Gabriel that women who are
 AMA are at an increased risk for gestational diabetes, fetal
 growth restriction and preeclampsia.

 She has a BMI > 30 therefore we reviwed the
 recommendation for a 11-20lb weight gain.
Recommendations

 A complete/detailed ultrasound recommended in 4-6 weeks.

## 2020-08-25 NOTE — Progress Notes (Signed)
Referring Provider:  Federico Flake Length of Consultation: 40 minutes  Ms. Ree Edman was referred to [facility] at [place] for genetic counseling because of advanced maternal age.  The patient will be 35 years old at the time of delivery.  This note summarizes the information we discussed.    We explained that the chance of a chromosome abnormality increases with maternal age.  Chromosomes and examples of chromosome problems were reviewed.  Humans typically have 46 chromosomes in each cell, with half passed through each sperm and egg.  Any change in the number or structure of chromosomes can increase the risk of problems in the physical and mental development of a pregnancy.   Based upon age of the patient and the current gestational age, the chance of any chromosome abnormality was 1 in 10. The chance of Down syndrome, the most common chromosome problem associated with maternal age, was 1 in 8.  The risk of chromosome problems is in addition to the 3% general population risk for birth defects and mental retardation.  The greatest chance, of course, is that the baby would be born in good health.  We discussed the following prenatal screening and testing options for this pregnancy:  Cell free fetal DNA testing from maternal blood may be used to determine whether or not the baby may have Down syndrome, trisomy 69, or trisomy 69.  This test utilizes a maternal blood sample and DNA sequencing technology to isolate circulating cell free fetal DNA from maternal plasma.  The fetal DNA can then be analyzed for DNA sequences that are derived from the three most common chromosomes involved in aneuploidy, chromosomes 13, 18, and 21.  If the overall amount of DNA is greater than the expected level for any of these chromosomes, aneuploidy is suspected.  While we do not consider it a replacement for invasive testing and karyotype analysis, a negative result from this testing would be  reassuring, though not a guarantee of a normal chromosome complement for the baby.  An abnormal result is certainly suggestive of an abnormal chromosome complement, though we would still recommend amniocentesis to confirm any findings from this testing.  Maternal serum marker screening, a blood test that measures pregnancy proteins, can provide risk assessments for Down syndrome, trisomy 18, and open neural tube defects (spina bifida, anencephaly). Because it does not directly examine the fetus, it cannot positively diagnose or rule out these problems. If cell free fetal DNA testing is ordered, then an AFP only is recommended to screening for ONTDs only.  Targeted ultrasound uses high frequency sound waves to create an image of the developing fetus.  An ultrasound is often recommended as a routine means of evaluating the pregnancy.  It is also used to screen for fetal anatomy problems (for example, a heart defect) that might be suggestive of a chromosomal or other abnormality.   Amniocentesis involves the removal of a small amount of amniotic fluid from the sac surrounding the fetus with the use of a thin needle inserted through the maternal abdomen and uterus.  Ultrasound guidance is used throughout the procedure.  Fetal cells from amniotic fluid are directly evaluated and > 99.5% of chromosome problems and > 98% of open neural tube defects can be detected. This procedure is generally performed after the 15th week of pregnancy.  The main risks to this procedure include complications leading to miscarriage in less than 1 in 200 cases (0.5%).   Cystic Fibrosis and Spinal Muscular Atrophy (SMA)  screening were also discussed with the patient. Both conditions are recessive, which means that both parents must be carriers in order to have a child with the disease.  Cystic fibrosis (CF) is one of the most common genetic conditions in persons of Caucasian ancestry.  This condition occurs in approximately 1 in 2,500  Caucasian persons and results in thickened secretions in the lungs, digestive, and reproductive systems.  For a baby to be at risk for having CF, both of the parents must be carriers for this condition.  Approximately 1 in 19 Caucasian persons is a carrier for CF.  Current carrier testing looks for the most common mutations in the gene for CF and can detect approximately 90% of carriers in the Caucasian population.  This means that the carrier screening can greatly reduce, but cannot eliminate, the chance for an individual to have a child with CF.  If an individual is found to be a carrier for CF, then carrier testing would be available for the partner. As part of [place]'s newborn screening profile, all babies born in the state of [place] will have a two-tier screening process.  Specimens are first tested to determine the concentration of immunoreactive trypsinogen (IRT).  The top 5% of specimens with the highest IRT values then undergo DNA testing using a panel of over 40 common CF mutations. SMA is a neurodegenerative disorder that leads to atrophy of skeletal muscle and overall weakness.  This condition is also more prevalent in the Caucasian population, with 1 in 40-1 in 60 persons being a carrier and 1 in 6,000-1 in 10,000 children being affected.  There are multiple forms of the disease, with some causing death in infancy to other forms with survival into adulthood.  The genetics of SMA is complex, but carrier screening can detect up to 95% of carriers in the Caucasian population.  Similar to CF, a negative result can greatly reduce, but cannot eliminate, the chance to have a child with SMA. Hemoglobinopathy screening was previously performed at [facility] and was normal (AA), however, the patient has a low MCV which could be related to thalassemia.  If iron studies are normal, then additional genetic testing for thalassemias could be considered.  We obtained a detailed family history and pregnancy  history.  This is the fifth pregnancy for this patient, the first with her current partner.  She has three healthy children (21, 17 and 7 years).  The father of the baby, Franchot Gallo, has four children from a prior relationship.  One son, now 29 years old, is reported to have "muscular dystrophy" which prevents him from being able to run and jump.  He is not sure of the type of muscle condition, but plans to speak with the child's mother to get more information.  We reviewed that there may be many types of muscular dystrophies, including those with varying types of inheritance.  We talked briefly about x-linked and recessive inheritance, but would need medical information to determine the level of concern for this pregnancy as well as possible carrier screening options.  Ezekiel also reported a paternal first cousin who has a child born with multiple medical problems requiring a feeding tube and full time care.  He was also going to try to find out more specific information about that diagnosis as well.  Lastly, the patient stated that she has six siblings.  The youngest brother has a heart condition that was diagnosed in childhood following being sick with pneumonia and receiving medications.  The family believes his heart condition was due to the medications.  If it is related to damage from the illness and/or medications, then we would not expect an increased risk for other family members.  However, if he had a structural heart defect that happened to be diagnosed after the illness, there could be an increased risk for a structural heart defect in this pregnancy.  He has no other health concerns, birth defects or developmental differences to suggest a genetic syndrome.  In the absence of a known genetic condition, most heart defects are considered multifactorial with a recurrence of approximately 1% in a second degree relative.  A fetal echocardiogram could be considered, particularly if views of the heart are  abnormal or not well visualized on the 18 week anatomy ultrasound. The remainder of the family history is unremarkable for birth defects, developmental delays, recurrent pregnancy loss or known chromosome abnormalities.  Ms. Ree Edman reported no complications or exposures to medications, alcohol, tobacco or recreational drugs in this pregnancy.  After consideration of the options, Ms. Ree Edman elected to proceed with cell free fetal DNA testing and AFP only screening.  She declined carrier screening for CF and SMA.  An ultrasound was performed at the time of the visit.  The gestational age was consistent with 16 weeks 5 days.   No markers of aneuploidy were noted, but it is important to remember that a normal ultrasound does not exclude the possibility of birth defect or chromosome condition.  Please refer to the ultrasound report for details of that study. The patient will return to follow up on anatomy in 2 weeks.  Ms. Ree Edman was encouraged to call with questions or concerns.  We can be contacted at [phone].  Plan of Care: . NIPS and AFP only drawn today . Return for u/s to complete anatomy in 2 weeks . Consider fetal echo due to family history of possible heart defect in patient's brother . FOB to find out muscular dystrophy diagnosis in his 15 yo son and follow up   Tests Ordered: MaterniT21 PLUS with SCA, msAFP only  Cherly Anderson, MS, CGC

## 2020-08-26 ENCOUNTER — Ambulatory Visit: Payer: Medicaid Other | Admitting: Advanced Practice Midwife

## 2020-08-26 DIAGNOSIS — O09512 Supervision of elderly primigravida, second trimester: Secondary | ICD-10-CM

## 2020-08-26 DIAGNOSIS — O99210 Obesity complicating pregnancy, unspecified trimester: Secondary | ICD-10-CM

## 2020-08-26 DIAGNOSIS — O09519 Supervision of elderly primigravida, unspecified trimester: Secondary | ICD-10-CM

## 2020-08-26 DIAGNOSIS — O0990 Supervision of high risk pregnancy, unspecified, unspecified trimester: Secondary | ICD-10-CM

## 2020-08-26 LAB — URINALYSIS
Bilirubin, UA: NEGATIVE
Glucose, UA: NEGATIVE
Ketones, UA: NEGATIVE
Nitrite, UA: NEGATIVE
Protein,UA: NEGATIVE
RBC, UA: NEGATIVE
Specific Gravity, UA: 1.025 (ref 1.005–1.030)
Urobilinogen, Ur: 0.2 mg/dL (ref 0.2–1.0)
pH, UA: 7 (ref 5.0–7.5)

## 2020-08-26 NOTE — Progress Notes (Signed)
   PRENATAL VISIT NOTE  Subjective:  Maureen Johnston is a 35 y.o. [phone] at [redacted]w[redacted]d being seen today for ongoing prenatal care.  She is currently monitored for the following issues for this high-risk pregnancy and has Supervision of high risk pregnancy, antepartum; Maternal obesity, antepartum; and Advanced maternal age, primigravida, antepartum 35 yo on their problem list.  Patient reports no complaints.  Contractions: Not present. Vag. Bleeding: None.  Movement: Absent. Denies leaking of fluid/ROM.   The following portions of the patient's history were reviewed and updated as appropriate: allergies, current medications, past family history, past medical history, past social history, past surgical history and problem list. Problem list updated.  Objective:   Vitals:   08/26/20 0920  BP: 132/82  Pulse: 66  Temp: 98.5 F (36.9 C)  Weight: 165 lb (74.8 kg)    Fetal Status: Fetal Heart Rate (bpm): 150 Fundal Height: 16 cm Movement: Absent     General:  Alert, oriented and cooperative. Patient is in no acute distress.  Skin: Skin is warm and dry. No rash noted.   Cardiovascular: Normal heart rate noted  Respiratory: Normal respiratory effort, no problems with respiration noted  Abdomen: Soft, gravid, appropriate for gestational age.  Pain/Pressure: Absent     Pelvic: Cervical exam deferred        Extremities: Normal range of motion.  Edema: None  Mental Status: Normal mood and affect. Normal behavior. Normal judgment and thought content.   Assessment and Plan:  Pregnancy: [id number] at [redacted]w[redacted]d  1. Maternal obesity, antepartum Taking ASA 81 mg daily 30 lb (13.6 kg) Walking 3-4x/wk x 40 min  2. Supervision of high risk pregnancy, antepartum 132/82; denies h/a, states she was late today due to construction--u/a done Not working Reviewed 08/25/20 u/s with change in EDC to 02/09/21, AFI wnl, EFW=14% at 16.0 wks. GC and NIPS and AFP only done. Pt decided she does not want fetal ECHO. F/u  u/s 09/27/20 - Urinalysis (Urine Dip)  3. Advanced maternal age, primigravida, antepartum 35 yo Taking ASA 81 mg daily   Preterm labor symptoms and general obstetric precautions including but not limited to vaginal bleeding, contractions, leaking of fluid and fetal movement were reviewed in detail with the patient. Please refer to After Visit Summary for other counseling recommendations.  No follow-ups on file.  Future Appointments  Date Time Provider Department Center  09/27/2020  1:00 PM [facility]1 [facility]    Alberteen Spindle, CNM

## 2020-08-26 NOTE — Progress Notes (Signed)
Patient here for MH RV at 18 6/7. Had [facility] U/S and genetic testing yesterday. Follow-up U/S scheduled for 09/27/2020.Marland KitchenBurt Knack, RN

## 2020-08-31 LAB — MATERNIT21 PLUS CORE+SCA
Fetal Fraction: 8
Monosomy X (Turner Syndrome): NOT DETECTED
Result (T21): NEGATIVE
Trisomy 13 (Patau syndrome): NEGATIVE
Trisomy 18 (Edwards syndrome): NEGATIVE
Trisomy 21 (Down syndrome): NEGATIVE
XXX (Triple X Syndrome): NOT DETECTED
XXY (Klinefelter Syndrome): NOT DETECTED
XYY (Jacobs Syndrome): NOT DETECTED

## 2020-09-01 ENCOUNTER — Telehealth: Payer: Self-pay | Admitting: Obstetrics and Gynecology

## 2020-09-01 LAB — MISC LABCORP TEST (SEND OUT): Labcorp test code: 10801

## 2020-09-01 NOTE — Telephone Encounter (Signed)
With the aid of a Spanish interpreter, the patient was informed of the results of her recent MaterniT21 testing which yielded NEGATIVE results.  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  The sensitivity for trisomy 33, trisomy 47 and trisomy 76 using this testing are reported as 99.1%, 99.9% and 91.7% respectively.  Thus, while the results of this testing are highly accurate, they are not considered diagnostic at this time.  Should more definitive information be desired, the patient may still consider amniocentesis.   As requested to know by the patient, sex chromosome analysis was included for this sample.  Results are consistent with a female (XY) fetus. This is predicted with >99% accuracy. This testing also screens for sex chromosome conditions with greater than 96% accuracy and was negative for those conditions.   Ms. Maureen Johnston also elected to have maternal serum AFP only screening for open neural tube defects.  The results of this screening are within normal limits with a resulting risk for open spina bifida of 1 in 462 (2.26MoM).  This testing detects greater than 80% of open neural tube defects and when combined with second trimester ultrasound the detection is further increased. It is important to remember that not all birth defects can be detected prenatally.  We may be reached at [phone] with any questions or concerns.  Cherly Anderson, MS, CGC

## 2020-09-21 ENCOUNTER — Other Ambulatory Visit: Payer: Self-pay | Admitting: Family Medicine

## 2020-09-21 DIAGNOSIS — O09522 Supervision of elderly multigravida, second trimester: Secondary | ICD-10-CM

## 2020-09-21 DIAGNOSIS — O99212 Obesity complicating pregnancy, second trimester: Secondary | ICD-10-CM

## 2020-09-23 ENCOUNTER — Encounter: Payer: Self-pay | Admitting: Physician Assistant

## 2020-09-23 ENCOUNTER — Other Ambulatory Visit: Payer: Self-pay

## 2020-09-23 ENCOUNTER — Ambulatory Visit: Payer: Self-pay | Admitting: Physician Assistant

## 2020-09-23 VITALS — BP 123/82 | HR 68 | Temp 98.0°F | Wt 167.0 lb

## 2020-09-23 DIAGNOSIS — O09519 Supervision of elderly primigravida, unspecified trimester: Secondary | ICD-10-CM

## 2020-09-23 DIAGNOSIS — O0990 Supervision of high risk pregnancy, unspecified, unspecified trimester: Secondary | ICD-10-CM

## 2020-09-23 DIAGNOSIS — O99210 Obesity complicating pregnancy, unspecified trimester: Secondary | ICD-10-CM

## 2020-09-23 DIAGNOSIS — O09512 Supervision of elderly primigravida, second trimester: Secondary | ICD-10-CM

## 2020-09-23 NOTE — Progress Notes (Signed)
   PRENATAL VISIT NOTE  Subjective:  Maureen Johnston is a 35 y.o. [phone] at [redacted]w[redacted]d being seen today for ongoing prenatal care.  She is currently monitored for the following issues for this high-risk pregnancy and has Supervision of high risk pregnancy, antepartum; Maternal obesity, antepartum; and Advanced maternal age, primigravida, antepartum 35 yo on their problem list.  Patient reports no complaints.  Contractions: Not present. Vag. Bleeding: None.  Movement: Absent. Denies leaking of fluid/ROM.   The following portions of the patient's history were reviewed and updated as appropriate: allergies, current medications, past family history, past medical history, past social history, past surgical history and problem list. Problem list updated.  Objective:   Vitals:   09/23/20 0917  BP: 123/82  Pulse: 68  Temp: 98 F (36.7 C)  Weight: 167 lb (75.8 kg)    Fetal Status: Fetal Heart Rate (bpm): 148 Fundal Height: 19 cm Movement: Absent     General:  Alert, oriented and cooperative. Patient is in no acute distress.  Skin: Skin is warm and dry. No rash noted.   Cardiovascular: Normal heart rate noted  Respiratory: Normal respiratory effort, no problems with respiration noted  Abdomen: Soft, gravid, appropriate for gestational age.  Pain/Pressure: Absent     Pelvic: Cervical exam deferred        Extremities: Normal range of motion.  Edema: None  Mental Status: Normal mood and affect. Normal behavior. Normal judgment and thought content.   Assessment and Plan:  Pregnancy: [id number] at [redacted]w[redacted]d  1. Supervision of high risk pregnancy, antepartum Doing well. To keep anat [place] as sched 09/27/20.  2. Maternal obesity, antepartum Reviewed weight gain, suspect reported pre-preg weight was lower than actual and that pregnancy weight gain has been appropriate (note less than 3 lb wt gain since 3/18.) Continue daily low dose aspirin.  3. Advanced maternal age, primigravida, antepartum 35  yo Continue aspirin.   Preterm labor symptoms and general obstetric precautions including but not limited to vaginal bleeding, contractions, leaking of fluid and fetal movement were reviewed in detail with the patient. Please refer to After Visit Summary for other counseling recommendations.  Return in about 4 weeks (around 10/21/2020) for Routine prenatal care.  Future Appointments  Date Time Provider Department Center  09/27/2020  1:00 PM [facility]  10/21/2020 10:00 AM [facility] PROVIDER [facility] None    Landry Dyke, PA-C

## 2020-09-27 ENCOUNTER — Ambulatory Visit: Payer: Self-pay | Attending: Obstetrics and Gynecology

## 2020-09-27 ENCOUNTER — Other Ambulatory Visit: Payer: Self-pay

## 2020-09-27 DIAGNOSIS — O09522 Supervision of elderly multigravida, second trimester: Secondary | ICD-10-CM

## 2020-09-27 DIAGNOSIS — Z3A20 20 weeks gestation of pregnancy: Secondary | ICD-10-CM

## 2020-09-27 DIAGNOSIS — E669 Obesity, unspecified: Secondary | ICD-10-CM

## 2020-09-27 DIAGNOSIS — O99212 Obesity complicating pregnancy, second trimester: Secondary | ICD-10-CM

## 2020-09-27 IMAGING — US US MFM OB DETAIL+14 WK
1 series · 13 of 28 positions shown · non-contrast
Comparison: none

[Series 1: us mfm ob detail+14 wk · 91 acquisitions, 13 frames shown]
[im 4/91]
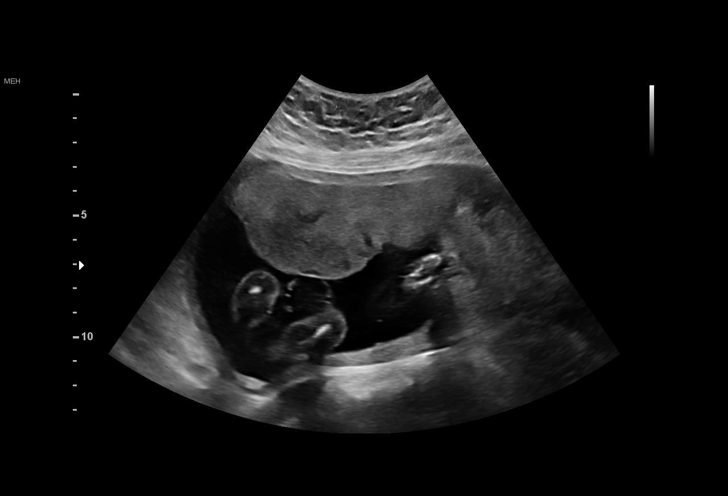
[im 11/91]
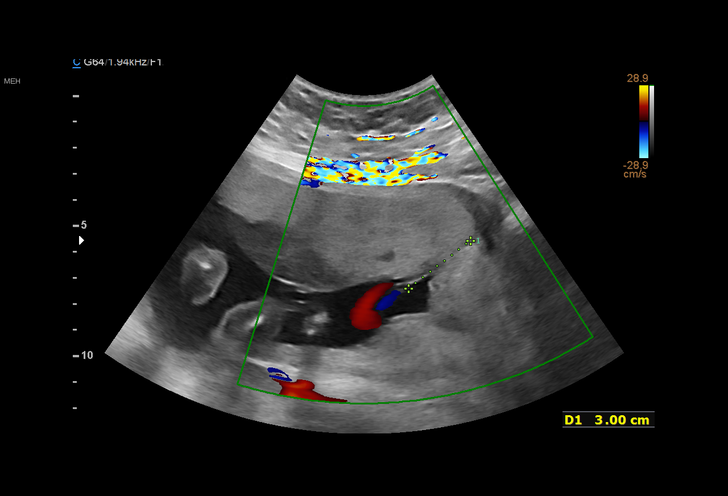
[im 17/91]
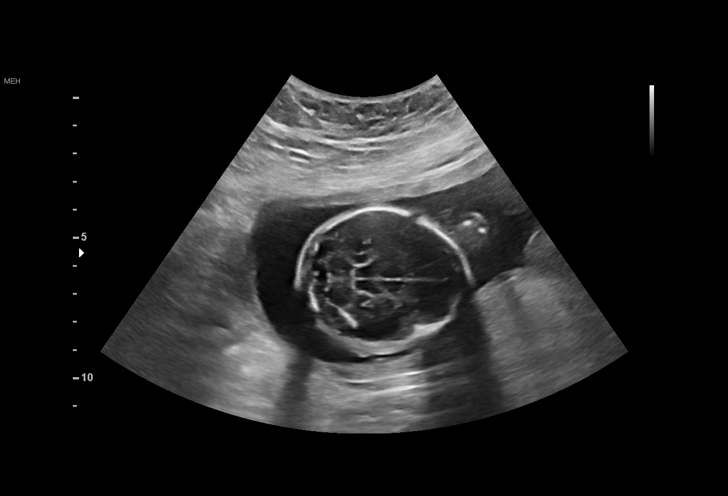
[im 24/91]
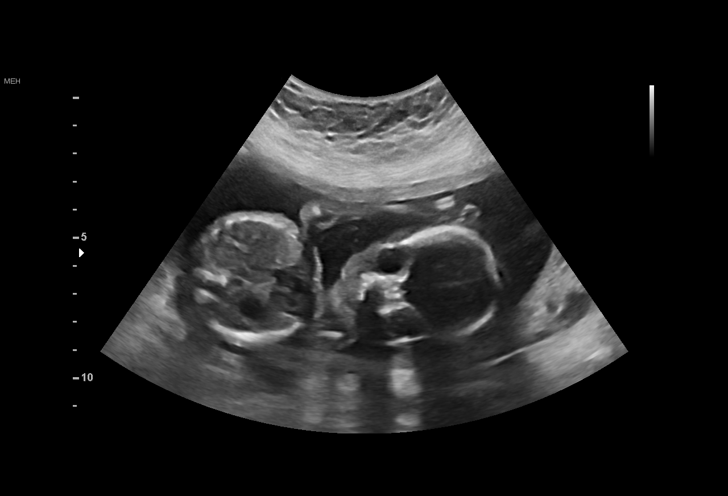
[im 31/91]
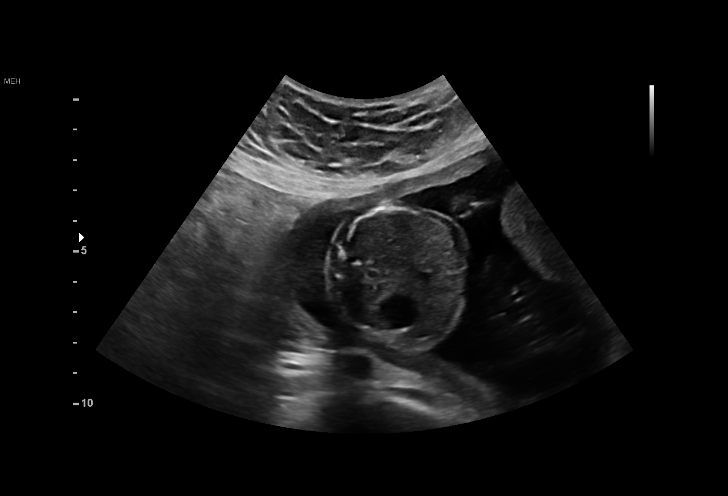
[im 37/91]
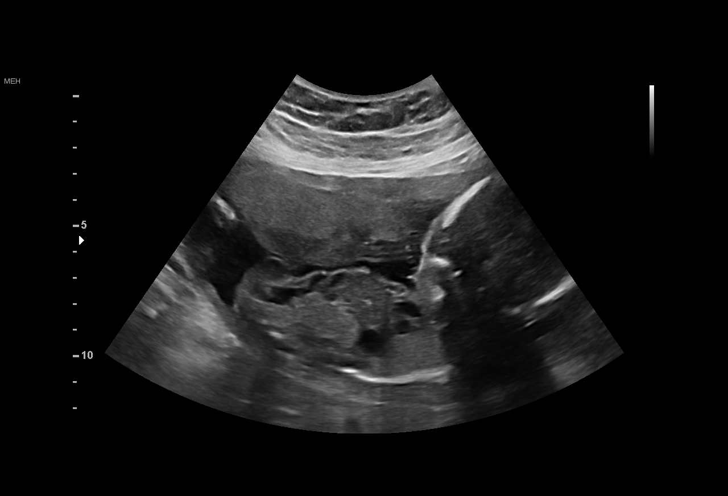
[im 47/91]
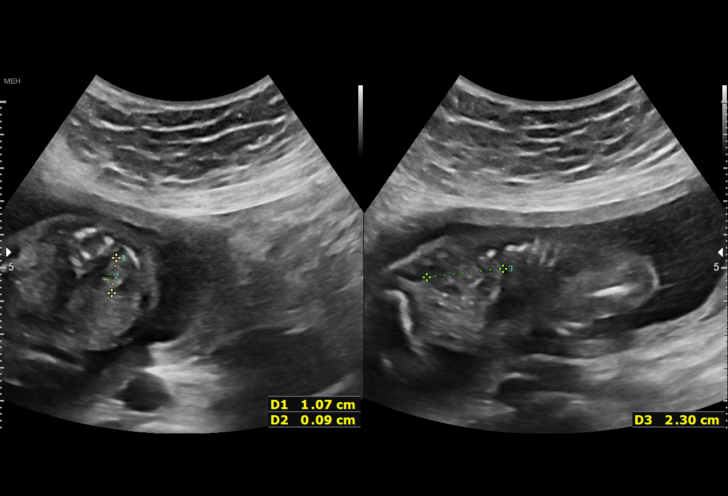
[im 54/91]
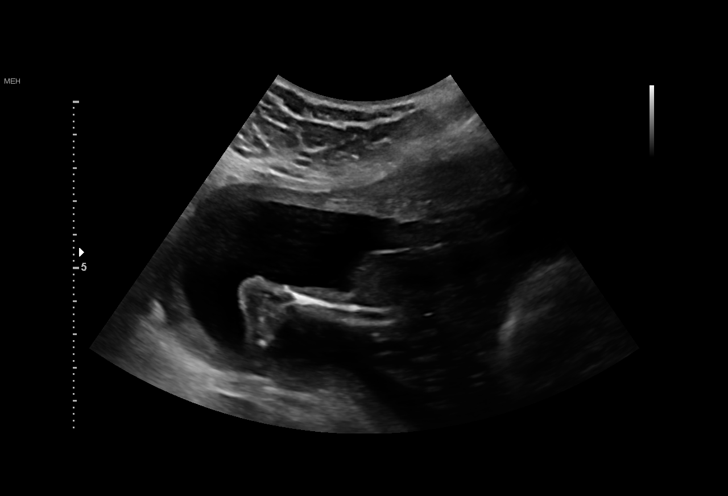
[im 61/91]
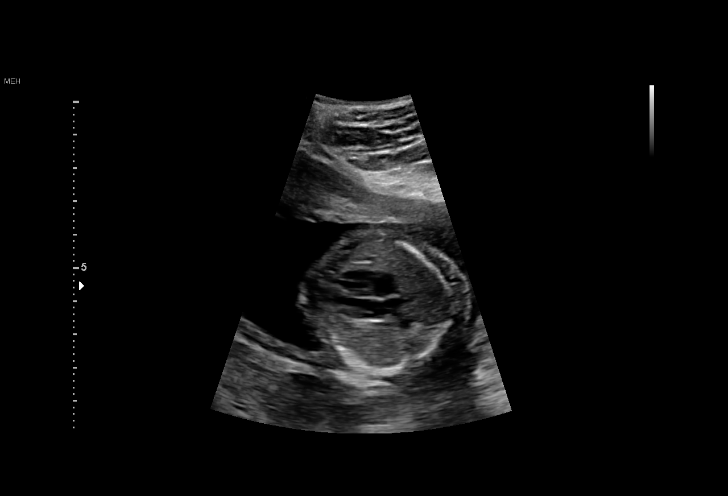
[im 67/91]
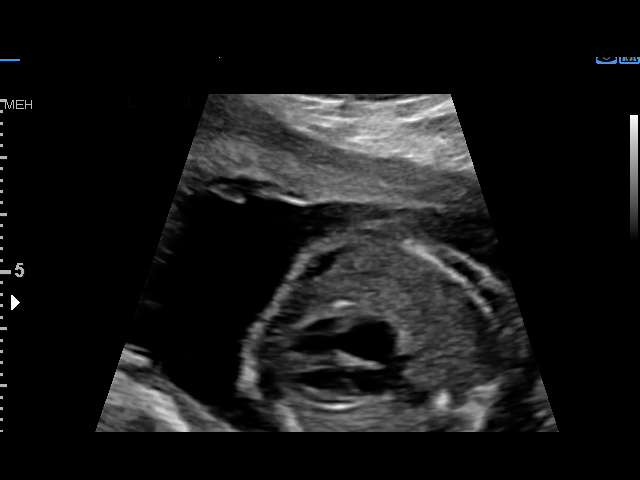
[im 74/91]
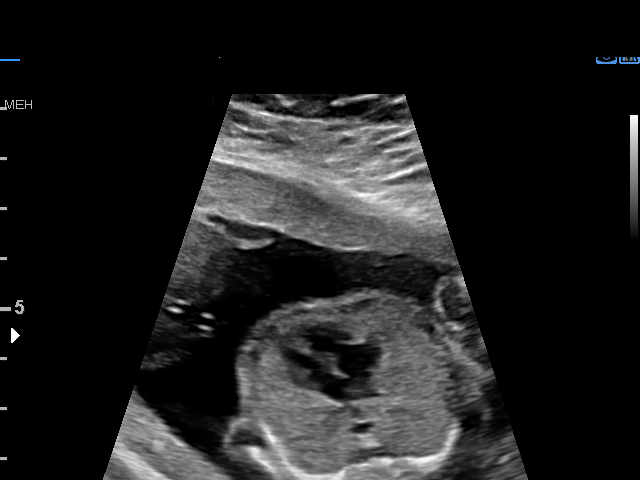
[im 81/91]
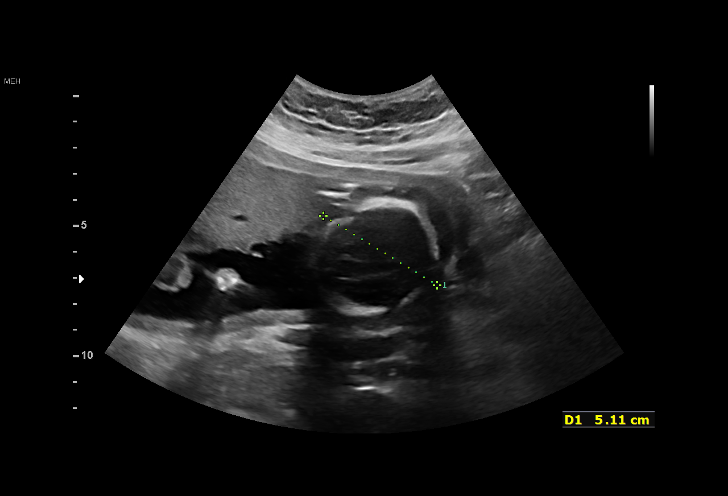
[im 87/91]
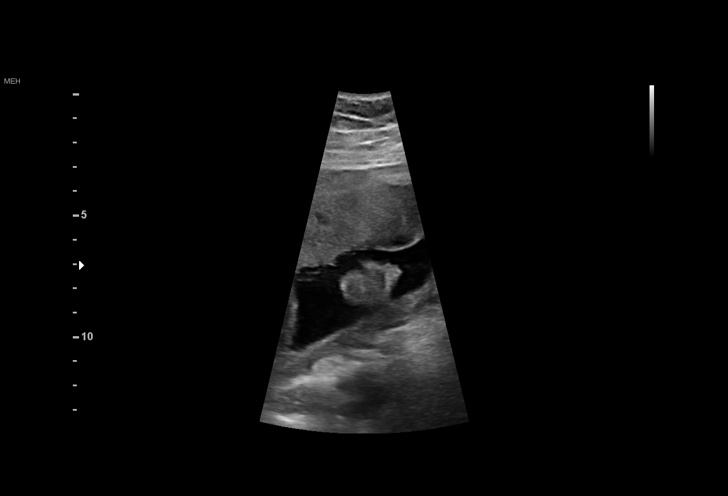

[13 of 28 positions shown; findings below may reference images not displayed]

Indications

 20 weeks gestation of pregnancy
 Advanced maternal age multigravida 35+,
 second trimester
 Obesity complicating pregnancy, second
 trimester
Fetal Evaluation

 Num Of Fetuses:         1
 Fetal Heart Rate(bpm):  144
 Cardiac Activity:       Observed
 Presentation:           Cephalic
 Placenta:               Anterior
 P. Cord Insertion:      Visualized, central

 Amniotic Fluid
 AFI FV:      Within normal limits
Biometry

 BPD:      49.2  mm     G. Age:  20w 6d         57  %    CI:        73.39   %    70 - 86
                                                         FL/HC:      17.2   %    15.9 -
 HC:      182.5  mm     G. Age:  20w 4d         37  %    HC/AC:      1.19        1.06 -
 AC:       154   mm     G. Age:  20w 4d         39  %    FL/BPD:     63.6   %
 FL:       31.3  mm     G. Age:  19w 5d         12  %    FL/AC:      20.3   %    20 - 24
 HUM:      29.9  mm     G. Age:  19w 6d         25  %

 Est. FW:     342  gm    0 lb 12 oz      22  %
Gestational Age
 LMP:           23w 3d        Date:  04/16/20                 EDD:   01/21/21
 U/S Today:     20w 3d                                        EDD:   02/11/21
 Best:          20w 5d     Det. By:  U/S  (08/25/20)          EDD:   02/09/21
Anatomy

 Cranium:               Appears normal         Aortic Arch:            Appears normal
 Cavum:                 Appears normal         Ductal Arch:            Appears normal
 Ventricles:            Appears normal         Diaphragm:              Appears normal
 Choroid Plexus:        Appears normal         Stomach:                Appears normal, left
                                                                       sided
 Cerebellum:            Appears normal         Abdomen:                Appears normal
 Posterior Fossa:       Appears normal         Abdominal Wall:         Appears nml (cord
                                                                       insert, abd wall)
 Nuchal Fold:           Appears normal         Cord Vessels:           Appears normal (3
                                                                       vessel cord)
 Face:                  Appears normal         Kidneys:                Appear normal
                        (orbits and profile)
 Lips:                  Appears normal         Bladder:                Appears normal
 Thoracic:              Appears normal         Spine:                  Appears normal
 Heart:                 Appears normal         Upper Extremities:      Appears normal
                        (4CH, axis, and
                        situs)
 RVOT:                  Appears normal         Lower Extremities:      Appears normal
 LVOT:                  Appears normal
Cervix Uterus Adnexa

 Cervix
 Length:            4.4  cm.
Impression

 G5 P3.  Patient is here for fetal anatomy scan.  On cell free
 fetal DNA screening, the risks of fetal aneuploidies are not
 increased.  MSAFP screening showed low risk for open
 neural tube defects.
 Obstetric history significant for 3 term vaginal deliveries.
 We performed a fetal anatomy scan. No markers of
 aneuploidies or fetal structural defects are seen. Fetal
 biometry is consistent with her previously-established dates.
 Amniotic fluid is normal and good fetal activity is seen.
 Patient understands the limitations of ultrasound in detecting
 fetal anomalies.

 Maternal obesity imposes limitations on the resolution of
 images, and failure to detect fetal anomalies is more
 common.
 This study was remotely read.
Recommendations

 -Fetal growth assessment at 32 weeks' gestation.
                 Meherremov, [place]

## 2020-10-21 ENCOUNTER — Ambulatory Visit: Payer: Self-pay | Admitting: Family Medicine

## 2020-10-21 ENCOUNTER — Other Ambulatory Visit: Payer: Self-pay

## 2020-10-21 VITALS — BP 124/87 | HR 63 | Temp 98.6°F | Wt 175.8 lb

## 2020-10-21 DIAGNOSIS — O09512 Supervision of elderly primigravida, second trimester: Secondary | ICD-10-CM

## 2020-10-21 DIAGNOSIS — O0990 Supervision of high risk pregnancy, unspecified, unspecified trimester: Secondary | ICD-10-CM

## 2020-10-21 LAB — URINALYSIS
Bilirubin, UA: NEGATIVE
Glucose, UA: NEGATIVE
Ketones, UA: NEGATIVE
Leukocytes,UA: NEGATIVE
Nitrite, UA: NEGATIVE
Protein,UA: NEGATIVE
RBC, UA: NEGATIVE
Specific Gravity, UA: 1.025 (ref 1.005–1.030)
Urobilinogen, Ur: 0.2 mg/dL (ref 0.2–1.0)
pH, UA: 5.5 (ref 5.0–7.5)

## 2020-10-21 MED ORDER — PRENATAL MULTIVITAMIN CH: 1.0000 | ORAL_TABLET | Freq: Every day | ORAL | 0 refills | Status: AC

## 2020-10-21 NOTE — Progress Notes (Signed)
   PRENATAL VISIT NOTE  Subjective:  Maureen Johnston is a 34 y.o. [phone] at [redacted]w[redacted]d being seen today for ongoing prenatal care.  She is currently monitored for the following issues for this high-risk pregnancy and has Supervision of high risk pregnancy, antepartum; Maternal obesity, antepartum; and Advanced maternal age, primigravida, antepartum 35 yo on their problem list.  Patient reports no complaints.  Contractions: Not present. Vag. Bleeding: None.  Movement: Present. Denies leaking of fluid/ROM.   The following portions of the patient's history were reviewed and updated as appropriate: allergies, current medications, past family history, past medical history, past social history, past surgical history and problem list. Problem list updated.  Objective:   Vitals:   10/21/20 1015 10/21/20 1037  BP: 130/90 124/87  Pulse: 64 63  Temp: 98.6 F (37 C)   Weight: 175 lb 12.8 oz (79.7 kg)     Fetal Status: Fetal Heart Rate (bpm): 140 Fundal Height: 25 cm Movement: Present     General:  Alert, oriented and cooperative. Patient is in no acute distress.  Skin: Skin is warm and dry. No rash noted.   Cardiovascular: Normal heart rate noted  Respiratory: Normal respiratory effort, no problems with respiration noted  Abdomen: Soft, gravid, appropriate for gestational age.  Pain/Pressure: Absent     Pelvic: Cervical exam deferred        Extremities: Normal range of motion.  Edema: None  Mental Status: Normal mood and affect. Normal behavior. Normal judgment and thought content.   Assessment and Plan:  Pregnancy: [id number] at [redacted]w[redacted]d  1. Supervision of high risk pregnancy, antepartum Blood pressure elevated today- RTC 1 week for recheck  Denies HA, blurry vision, n/v, epigastric pain Discussed diet, patient is heavily seasoning chicken.  Pt to reduce sodium intake.   Edema in hands  x 2 days only.   Anticipated guidance - next appt in 4 weeks 28 week labs, start every 2 week visits   -  Prenatal Vit-Fe Fumarate-FA (PRENATAL MULTIVITAMIN) TABS tablet; Take 1 tablet by mouth daily at 12 noon.  Dispense: 100 tablet; Refill: 0 - Urinalysis (Urine Dip)   Preterm labor symptoms and general obstetric precautions including but not limited to vaginal bleeding, contractions, leaking of fluid and fetal movement were reviewed in detail with the patient. Please refer to After Visit Summary for other counseling recommendations.  Return in about 4 weeks (around 11/18/2020) for 28 week labs, routine prenatal care.  Future Appointments  Date Time Provider Department Center  10/28/2020 10:40 AM [facility] PROVIDER [facility] None  11/18/2020 10:00 AM [facility] PROVIDER [facility] None  12/20/2020 11:00 AM [facility]    M. [place] used for [place] interpretation.     Wendi Snipes, FNP

## 2020-10-21 NOTE — Progress Notes (Signed)
PNV given today per patient request.   Urine dip reviewed with provider, Maureen Rising, FNP during clinic visit.   Patient to follow-up with BP check in a week.   Floy Sabina, RN

## 2020-10-25 ENCOUNTER — Ambulatory Visit: Payer: Self-pay

## 2020-10-28 ENCOUNTER — Ambulatory Visit: Payer: Self-pay | Admitting: Advanced Practice Midwife

## 2020-10-28 ENCOUNTER — Other Ambulatory Visit: Payer: Self-pay

## 2020-10-28 VITALS — BP 132/79 | HR 61 | Temp 98.4°F | Wt 175.6 lb

## 2020-10-28 DIAGNOSIS — O0990 Supervision of high risk pregnancy, unspecified, unspecified trimester: Secondary | ICD-10-CM

## 2020-10-28 NOTE — Progress Notes (Signed)
Consulted on the plan of care for this client.  I agree with the documented note and actions taken to provide care for this client.  Maureen Johnston, CNM I did not see this pt but am cosigning 

## 2020-10-28 NOTE — Patient Instructions (Signed)
Preeclampsia y eclampsia  Preeclampsia and Eclampsia  La preeclampsia es una afeccin grave que puede desarrollarse durante el embarazo. Esta afeccin involucra presin arterial alta durante el embarazo y causa sntomas, como dolores de cabeza, cambios en la visin y aumento de la hinchazn en las piernas, las manos y el rostro. La preeclampsia se presenta despus de las 20 semanas de embarazo. La eclampsia es una crisis convulsiva que se produce por el empeoramiento de la preeclampsia. El diagnstico y el tratamiento tempranos de la preeclampsia son importantes. Si no se la trata de inmediato, puede causarles problemas graves a la madre y al beb.  No hay cura para esta afeccin. Sin embargo, durante el embarazo, el parto del beb puede ser el mejor tratamiento para la preeclampsia o la eclampsia. En la mayora de las mujeres, los sntomas de la preeclampsia y la eclampsia desaparecen despus de dar a luz. En casos poco frecuentes, una mujer puede desarrollar preeclampsia o eclampsia despus de dar a luz. Esto normalmente ocurre dentro de las 48 horas despus del nacimiento del beb, pero puede aparecer hasta 6 semanas despus de dar a luz.  Cules son las causas?  Se desconoce la causa de esta afeccin.  Qu incrementa el riesgo?  Los siguientes factores pueden hacer que usted sea ms propensa a tener preeclampsia:  Estar embarazada por primera vez o de ms de un beb.  Haber tenido preeclampsia o una afeccin llamada hemlisis, aumento de las enzimas hepticas, y sndrome de bajo recuento de plaquetas (HELLP)en un embarazo anterior.  Tener antecedentes familiares de preeclampsia.  Tener ms de 35 aos.  Ser obesa.  Haber quedado embarazada a travs de tratamientos de fertilidad.  Las afecciones que reducen el flujo sanguneo o el oxgeno a la placenta y al beb tambin pueden aumentar el riesgo. Estas incluyen:  Presin arterial alta antes, durante o inmediatamente despus del embarazo.  Enfermedad  renal.  Diabetes.  Trastornos de la coagulacin de la sangre.  Enfermedades autoinmunitarias, como el lupus.  Apnea del sueo.  Cules son los signos o sntomas?  Los sntomas frecuentes de esta afeccin incluyen los siguientes:  Dolor de cabeza intenso y punzante que no desaparece.  Problemas visuales, como visin doble o borrosa y sensibilidad a la luz.  Dolor en el estmago, especialmente en la regin superior derecha.  Dolor en el hombro.  Otros sntomas que pueden aparecer a medida que la afeccin empeora incluyen:  Aumento repentino de peso debido a la acumulacin de lquido en el cuerpo. Esto causa hinchazn en el rostro, las manos las piernas y los pies.  Nuseas y vmitos intensos.  Orinar menos que lo habitual.  Falta de aire.  Convulsiones.  Cmo se diagnostica?    El mdico le har preguntas sobre los sntomas y verificar la presencia de signos de preeclampsia durante las visitas prenatales. Tambin le harn pruebas de rutina, entre ellas:  Control de la presin arterial.  Anlisis de orina para detectar la presencia de protenas.  Anlisis de sangre para evaluar la funcin de los rganos.  Control de la frecuencia cardaca del beb.  Ecografas para verificar el crecimiento fetal.  Cmo se trata?  Usted junto con su mdico determinarn el mejor tratamiento para usted. El tratamiento puede incluir:  Visitas prenatales frecuentes para detectar la presencia de preeclampsia.  Medicamentos para bajar la presin arterial.  Medicamentos para prevenir convulsiones.  Aspirina de dosis baja durante el embarazo.  Quedarse en el hospital, en los casos graves. Le darn medicamentos para   controlar la presin arterial y la cantidad de lquido en el cuerpo.  Dar a luz al beb.  Trabaje con el mdico para manejar cualquier afeccin mdica crnica que tenga, como diabetes o problemas renales. Tambin trabaje con el mdico para manejar el aumento de peso durante el embarazo.  Siga estas instrucciones en su  casa:  Comida y bebida  Beber suficiente lquido como para mantener la orina de color amarillo plido.  Evite la cafena. La cafena puede aumentar la presin arterial y la frecuencia cardaca, y causar deshidratacin.  Reduzca la cantidad de sal que ingiere.  Estilo de vida  No consuma ningn producto que contenga nicotina o tabaco. Estos productos incluyen cigarrillos, tabaco para mascar y aparatos de vapeo, como los cigarrillos electrnicos. Si necesita ayuda para dejar de fumar, consulte al mdico.  No consuma drogas ni alcohol.  Evite las situaciones estresantes tanto como sea posible.  Haga reposo y duerma bien.  Instrucciones generales    Use los medicamentos de venta libre y los recetados solamente como se lo haya indicado el mdico.  Acustese del lado izquierdo mientras hace reposo. Esto mantiene la presin fuera de los vasos sanguneos principales.  Levante (eleve) los pies mientras est sentada o acostada. Pruebe colocarse almohadas debajo de las pantorrillas.  Haga ejercicio regularmente. Consulte al mdico qu tipos de ejercicios son seguros para usted.  Mida su presin arterial con la frecuencia recomendada por su mdico.  Concurra a todas las visitas prenatales y de seguimiento. Esto es importante.  Comunquese con un mdico si:  Tiene sntomas que pueden requerir tratamiento o supervisin ms estrecha. Estos incluyen:  Dolores de cabeza.  Dolor de estmago o nuseas y vmitos.  Dolor en el hombro.  Problemas de visin, como manchas frente a los ojos o visin borrosa.  Aumento repentino de peso o aumento de la hinchazn en el rostro, las manos, las piernas y los pies.  Aumento de la ansiedad o sensacin de muerte inminente.  Signos o sntomas de trabajo de parto.  Solicite ayuda de inmediato si:  Presenta alguno de los siguientes sntomas:  Una convulsin.  Falta de aire o dificultad para respirar.  Dificultad para hablar o arrastrar las palabras.  Desmayos.  Dolor de pecho.  Estos sntomas pueden  representar un problema grave que constituye una emergencia. No espere a ver si los sntomas desaparecen. Solicite atencin mdica de inmediato. Comunquese con el servicio de emergencias de su localidad (911 en los [place]). No conduzca por sus propios medios hasta el hospital.  Resumen  La preeclampsia es una afeccin grave que puede desarrollarse durante el embarazo.  El diagnstico y el tratamiento tempranos de la preeclampsia son muy importantes.  Concurra a todas las visitas prenatales y de seguimiento. Esto es importante.  Obtenga ayuda de inmediato si tiene una convulsin, le falta el aire o tiene dificultad para respirar, tiene problemas para hablar o arrastra las palabras, tiene dolor en el pecho o se desmaya.  Esta informacin no tiene como fin reemplazar el consejo del mdico. Asegrese de hacerle al mdico cualquier pregunta que tenga.  Document Revised: 02/24/2020 Document Reviewed: 02/24/2020  Elsevier Patient Education  2022 Elsevier Inc.

## 2020-10-28 NOTE — Progress Notes (Signed)
Patient here at 25w 1d for blood pressure check.   No provider in clinic today. Patient okay with preceding visit with RN.   Patient states she has decreased her salt intake this past week. Reports positive fetal movement. Patient denies contractions, pain, vaginal bleeding, and LOF.   Initial BP today 132/79.   K. Alvester Morin, MD called and made aware of BP. Verbal order to given patient s/s of preeclampsia s/s to look out for and to give [place] a call.   Preeclampsia in pregnancy information given to patient in the AVS with s/s to look out for.   F/u appointment given for next week for BP check and see a provider.   Floy Sabina, RN

## 2020-11-04 ENCOUNTER — Ambulatory Visit: Payer: Self-pay

## 2020-11-04 ENCOUNTER — Other Ambulatory Visit: Payer: Self-pay

## 2020-11-04 ENCOUNTER — Ambulatory Visit: Payer: Self-pay | Admitting: Family Medicine

## 2020-11-04 VITALS — BP 148/98 | HR 61 | Temp 98.6°F | Wt 176.2 lb

## 2020-11-04 DIAGNOSIS — O0990 Supervision of high risk pregnancy, unspecified, unspecified trimester: Secondary | ICD-10-CM

## 2020-11-04 DIAGNOSIS — O99210 Obesity complicating pregnancy, unspecified trimester: Secondary | ICD-10-CM

## 2020-11-04 DIAGNOSIS — O169 Unspecified maternal hypertension, unspecified trimester: Secondary | ICD-10-CM

## 2020-11-04 DIAGNOSIS — O09512 Supervision of elderly primigravida, second trimester: Secondary | ICD-10-CM

## 2020-11-04 LAB — URINALYSIS
Bilirubin, UA: NEGATIVE
Glucose, UA: NEGATIVE
Ketones, UA: NEGATIVE
Nitrite, UA: NEGATIVE
RBC, UA: NEGATIVE
Specific Gravity, UA: 1.025 (ref 1.005–1.030)
Urobilinogen, Ur: 0.2 mg/dL (ref 0.2–1.0)
pH, UA: 5.5 (ref 5.0–7.5)

## 2020-11-04 NOTE — Progress Notes (Signed)
Urine dip reviewed by provider. Spot protein already ordered today by provider. PIH panel ordered also today per verbal order of Elveria Rising FNP-BC. Jossie Ng, RN

## 2020-11-05 LAB — AST+BUN+CREAT+LD+URIC A+HGB...
AST: 199 IU/L — ABNORMAL HIGH (ref 0–40)
BUN: 12 mg/dL (ref 6–20)
Creatinine, Ser: 0.72 mg/dL (ref 0.57–1.00)
Hematocrit: 41.8 % (ref 34.0–46.6)
Hemoglobin: 14.1 g/dL (ref 11.1–15.9)
LDH: 279 IU/L — ABNORMAL HIGH (ref 119–226)
Platelets: 199 10*3/uL (ref 150–450)
Uric Acid: 7.4 mg/dL — ABNORMAL HIGH (ref 2.6–6.2)
eGFR: 110 mL/min/{1.73_m2} (ref >59)

## 2020-11-05 LAB — PROTEIN / CREATININE RATIO, URINE
Creatinine, Urine: 134.2 mg/dL
Protein, Ur: 44.2 mg/dL
Protein/Creat Ratio: 329 mg/g creat — ABNORMAL HIGH (ref 0–200)

## 2020-11-06 DIAGNOSIS — O169 Unspecified maternal hypertension, unspecified trimester: Secondary | ICD-10-CM | POA: Insufficient documentation

## 2020-11-06 NOTE — Progress Notes (Signed)
   PRENATAL VISIT NOTE  Subjective:  Maureen Johnston is a 35 y.o. [phone] at [redacted]w[redacted]d being seen today for ongoing prenatal care.  She is currently monitored for the following issues for this high-risk pregnancy and has Supervision of high risk pregnancy, antepartum; Maternal obesity, antepartum; and Advanced maternal age, primigravida, antepartum 35 yo on their problem list.  Patient reports no complaints.  Contractions: Not present. Vag. Bleeding: None.  Movement: Present. Denies leaking of fluid/ROM.   The following portions of the patient's history were reviewed and updated as appropriate: allergies, current medications, past family history, past medical history, past social history, past surgical history and problem list. Problem list updated.  Objective:   Vitals:   11/04/20 1040 11/04/20 1106  BP: (!) 148/99 (!) 148/98  Pulse: 64 61  Temp: 98.6 F (37 C)   Weight: 176 lb 3.2 oz (79.9 kg)     Fetal Status: Fetal Heart Rate (bpm): 134 Fundal Height: 27 cm Movement: Present     General:  Alert, oriented and cooperative. Patient is in no acute distress.  Skin: Skin is warm and dry. No rash noted.   Cardiovascular: Normal heart rate noted  Respiratory: Normal respiratory effort, no problems with respiration noted  Abdomen: Soft, gravid, appropriate for gestational age.  Pain/Pressure: Absent     Pelvic: Cervical exam deferred        Extremities: Normal range of motion.  Edema: None  Mental Status: Normal mood and affect. Normal behavior. Normal judgment and thought content.   Assessment and Plan:  Pregnancy: [id number] at [redacted]w[redacted]d  1. Supervision of high risk pregnancy, antepartum Anticipated guidance 28 week appointment with labs and visit frequency changing to Q 2 weeks until 36 weeks.    - Urinalysis (Urine Dip) - Protein / creatinine ratio, urine  (Spot) - PIH Panel ([facility] [id number])  2. Elevated blood pressure affecting pregnancy, antepartum Pt blood pressures today is  elevated  -Urine + 1 protein, denies dizziness, HA, N/V, epigastric pain, edema, blurry vision, light sensitivity, or dark spots in vision.   -PIH labs drawn today.   -Pt taking  81mg  aspirin daily as directed  -denies hx of kidney problems  -S/sx of preeclampsia discussed, advised to let know/to ER asap if present.   3. Maternal obesity, antepartum Discussed diet and exercise,  walking and including no salt added diet.     Preterm labor symptoms and general obstetric precautions including but not limited to vaginal bleeding, contractions, leaking of fluid and fetal movement were reviewed in detail with the patient. Please refer to After Visit Summary for other counseling recommendations.  Return in about 3 days (around 11/07/2020) for BP Check.  Future Appointments  Date Time Provider Department Center  11/08/2020  9:40 AM [facility] PROVIDER [facility] None  11/18/2020 10:00 AM [facility] PROVIDER [facility] None  12/20/2020 11:00 AM [facility]   M. 12/22/2020 used for [place] interpretation.     [place], FNP

## 2020-11-08 ENCOUNTER — Encounter: Payer: Self-pay | Admitting: Obstetrics and Gynecology

## 2020-11-08 ENCOUNTER — Encounter (HOSPITAL_COMMUNITY): Payer: Self-pay | Admitting: Family Medicine

## 2020-11-08 ENCOUNTER — Other Ambulatory Visit: Payer: Self-pay | Admitting: Obstetrics & Gynecology

## 2020-11-08 ENCOUNTER — Inpatient Hospital Stay (HOSPITAL_COMMUNITY)
Admission: AD | Admit: 2020-11-08 | Discharge: 2020-11-11 | DRG: 788 | Disposition: A | Discharge status: Home / Self Care | Payer: Medicaid Other | Attending: Family Medicine | Admitting: Family Medicine

## 2020-11-08 ENCOUNTER — Ambulatory Visit: Payer: Self-pay | Admitting: Advanced Practice Midwife

## 2020-11-08 ENCOUNTER — Other Ambulatory Visit: Payer: Self-pay

## 2020-11-08 ENCOUNTER — Observation Stay
Admission: EM | Admit: 2020-11-08 | Discharge: 2020-11-08 | Disposition: A | Discharge status: Home / Self Care | Payer: Medicaid Other | Attending: Obstetrics and Gynecology | Admitting: Obstetrics and Gynecology

## 2020-11-08 VITALS — BP 150/99 | Temp 97.7°F | Wt 175.2 lb

## 2020-11-08 DIAGNOSIS — Z7982 Long term (current) use of aspirin: Secondary | ICD-10-CM | POA: Insufficient documentation

## 2020-11-08 DIAGNOSIS — O0990 Supervision of high risk pregnancy, unspecified, unspecified trimester: Secondary | ICD-10-CM

## 2020-11-08 DIAGNOSIS — O4592 Premature separation of placenta, unspecified, second trimester: Secondary | ICD-10-CM | POA: Diagnosis present

## 2020-11-08 DIAGNOSIS — O163 Unspecified maternal hypertension, third trimester: Secondary | ICD-10-CM

## 2020-11-08 DIAGNOSIS — O162 Unspecified maternal hypertension, second trimester: Secondary | ICD-10-CM | POA: Diagnosis present

## 2020-11-08 DIAGNOSIS — Z8759 Personal history of other complications of pregnancy, childbirth and the puerperium: Secondary | ICD-10-CM | POA: Diagnosis present

## 2020-11-08 DIAGNOSIS — O169 Unspecified maternal hypertension, unspecified trimester: Secondary | ICD-10-CM | POA: Diagnosis present

## 2020-11-08 DIAGNOSIS — O1414 Severe pre-eclampsia complicating childbirth: Principal | ICD-10-CM | POA: Diagnosis present

## 2020-11-08 DIAGNOSIS — Z3A36 36 weeks gestation of pregnancy: Secondary | ICD-10-CM | POA: Diagnosis not present

## 2020-11-08 DIAGNOSIS — O99214 Obesity complicating childbirth: Secondary | ICD-10-CM | POA: Diagnosis present

## 2020-11-08 DIAGNOSIS — O09519 Supervision of elderly primigravida, unspecified trimester: Secondary | ICD-10-CM

## 2020-11-08 DIAGNOSIS — O132 Gestational [pregnancy-induced] hypertension without significant proteinuria, second trimester: Secondary | ICD-10-CM | POA: Insufficient documentation

## 2020-11-08 DIAGNOSIS — Z20822 Contact with and (suspected) exposure to covid-19: Secondary | ICD-10-CM | POA: Diagnosis not present

## 2020-11-08 DIAGNOSIS — O99210 Obesity complicating pregnancy, unspecified trimester: Secondary | ICD-10-CM

## 2020-11-08 DIAGNOSIS — Z452 Encounter for adjustment and management of vascular access device: Secondary | ICD-10-CM

## 2020-11-08 DIAGNOSIS — Z6834 Body mass index (BMI) 34.0-34.9, adult: Secondary | ICD-10-CM | POA: Diagnosis present

## 2020-11-08 DIAGNOSIS — Z789 Other specified health status: Secondary | ICD-10-CM | POA: Diagnosis present

## 2020-11-08 DIAGNOSIS — O321XX0 Maternal care for breech presentation, not applicable or unspecified: Secondary | ICD-10-CM | POA: Diagnosis not present

## 2020-11-08 DIAGNOSIS — R7401 Elevation of levels of liver transaminase levels: Secondary | ICD-10-CM | POA: Diagnosis present

## 2020-11-08 DIAGNOSIS — O09513 Supervision of elderly primigravida, third trimester: Secondary | ICD-10-CM

## 2020-11-08 DIAGNOSIS — O1410 Severe pre-eclampsia, unspecified trimester: Secondary | ICD-10-CM

## 2020-11-08 DIAGNOSIS — O09522 Supervision of elderly multigravida, second trimester: Principal | ICD-10-CM | POA: Insufficient documentation

## 2020-11-08 DIAGNOSIS — O99213 Obesity complicating pregnancy, third trimester: Secondary | ICD-10-CM

## 2020-11-08 DIAGNOSIS — O1412 Severe pre-eclampsia, second trimester: Secondary | ICD-10-CM | POA: Diagnosis not present

## 2020-11-08 DIAGNOSIS — Z3A26 26 weeks gestation of pregnancy: Secondary | ICD-10-CM | POA: Diagnosis not present

## 2020-11-08 HISTORY — DX: Severe pre-eclampsia, unspecified trimester: O14.10

## 2020-11-08 LAB — CBC
HCT: 39.6 % (ref 36.0–46.0)
HCT: 42.1 % (ref 36.0–46.0)
Hemoglobin: 13.6 g/dL (ref 12.0–15.0)
Hemoglobin: 14.3 g/dL (ref 12.0–15.0)
MCH: 31.2 pg (ref 26.0–34.0)
MCH: 30.5 pg (ref 26.0–34.0)
MCHC: 34.3 g/dL (ref 30.0–36.0)
MCHC: 34 g/dL (ref 30.0–36.0)
MCV: 90.8 fL (ref 80.0–100.0)
MCV: 89.8 fL (ref 80.0–100.0)
Platelets: 185 10*3/uL (ref 150–400)
Platelets: 197 10*3/uL (ref 150–400)
RBC: 4.36 MIL/uL (ref 3.87–5.11)
RBC: 4.69 MIL/uL (ref 3.87–5.11)
RDW: 15.4 % (ref 11.5–15.5)
RDW: 15.4 % (ref 11.5–15.5)
WBC: 7.7 10*3/uL (ref 4.0–10.5)
WBC: 8.2 10*3/uL (ref 4.0–10.5)
nRBC: 0.3 % — ABNORMAL HIGH (ref 0.0–0.2)
nRBC: 0.4 % — ABNORMAL HIGH (ref 0.0–0.2)

## 2020-11-08 LAB — COMPREHENSIVE METABOLIC PANEL
ALT: 496 U/L — ABNORMAL HIGH (ref 0–44)
ALT: 580 U/L — ABNORMAL HIGH (ref 0–44)
AST: 365 U/L — ABNORMAL HIGH (ref 15–41)
AST: 443 U/L — ABNORMAL HIGH (ref 15–41)
Albumin: 2.9 g/dL — ABNORMAL LOW (ref 3.5–5.0)
Albumin: 2.7 g/dL — ABNORMAL LOW (ref 3.5–5.0)
Alkaline Phosphatase: 125 U/L (ref 38–126)
Alkaline Phosphatase: 142 U/L — ABNORMAL HIGH (ref 38–126)
Anion gap: 6 (ref 5–15)
Anion gap: 8 (ref 5–15)
BUN: 15 mg/dL (ref 6–20)
BUN: 12 mg/dL (ref 6–20)
CO2: 22 mmol/L (ref 22–32)
CO2: 19 mmol/L — ABNORMAL LOW (ref 22–32)
Calcium: 8.7 mg/dL — ABNORMAL LOW (ref 8.9–10.3)
Calcium: 8.1 mg/dL — ABNORMAL LOW (ref 8.9–10.3)
Chloride: 106 mmol/L (ref 98–111)
Chloride: 105 mmol/L (ref 98–111)
Creatinine, Ser: 0.72 mg/dL (ref 0.44–1.00)
Creatinine, Ser: 0.74 mg/dL (ref 0.44–1.00)
GFR, Estimated: >60 mL/min (ref >60)
GFR, Estimated: >60 mL/min (ref >60)
Glucose, Bld: 120 mg/dL — ABNORMAL HIGH (ref 70–99)
Glucose, Bld: 115 mg/dL — ABNORMAL HIGH (ref 70–99)
Potassium: 3.8 mmol/L (ref 3.5–5.1)
Potassium: 4.1 mmol/L (ref 3.5–5.1)
Sodium: 134 mmol/L — ABNORMAL LOW (ref 135–145)
Sodium: 132 mmol/L — ABNORMAL LOW (ref 135–145)
Total Bilirubin: 0.7 mg/dL (ref 0.3–1.2)
Total Bilirubin: 0.5 mg/dL (ref 0.3–1.2)
Total Protein: 6.6 g/dL (ref 6.5–8.1)
Total Protein: 6.8 g/dL (ref 6.5–8.1)

## 2020-11-08 LAB — SAMPLE TO BLOOD BANK

## 2020-11-08 LAB — URINALYSIS
Bilirubin, UA: NEGATIVE
Glucose, UA: NEGATIVE
Ketones, UA: NEGATIVE
Nitrite, UA: NEGATIVE
RBC, UA: NEGATIVE
Specific Gravity, UA: 1.015 (ref 1.005–1.030)
Urobilinogen, Ur: 0.2 mg/dL (ref 0.2–1.0)
pH, UA: 5.5 (ref 5.0–7.5)

## 2020-11-08 LAB — TYPE AND SCREEN
ABO/RH(D): O POS
Antibody Screen: NEGATIVE

## 2020-11-08 LAB — RESP PANEL BY RT-PCR (FLU A&B, COVID) ARPGX2
Influenza A by PCR: NEGATIVE
Influenza B by PCR: NEGATIVE
SARS Coronavirus 2 by RT PCR: NEGATIVE

## 2020-11-08 LAB — PROTEIN / CREATININE RATIO, URINE
Creatinine, Urine: 30 mg/dL
Protein Creatinine Ratio: 0.37 mg/mg{Cre} — ABNORMAL HIGH (ref 0.00–0.15)
Total Protein, Urine: 11 mg/dL

## 2020-11-08 MED ORDER — LABETALOL HCL 5 MG/ML IV SOLN
INTRAVENOUS | Status: AC
  Filled 2020-11-08: qty 4

## 2020-11-08 MED ORDER — LABETALOL HCL 5 MG/ML IV SOLN: 80.0000 mg | INTRAVENOUS | Status: DC | PRN

## 2020-11-08 MED ORDER — CALCIUM CARBONATE ANTACID 500 MG PO CHEW: 2.0000 | CHEWABLE_TABLET | ORAL | Status: DC | PRN

## 2020-11-08 MED ORDER — PRENATAL MULTIVITAMIN CH: 1.0000 | ORAL_TABLET | Freq: Every day | ORAL | Status: DC

## 2020-11-08 MED ORDER — LABETALOL HCL 5 MG/ML IV SOLN
40.0000 mg | INTRAVENOUS | Status: DC | PRN
  Administered 2020-11-08: 40 mg via INTRAVENOUS
  Filled 2020-11-08: qty 8

## 2020-11-08 MED ORDER — BETAMETHASONE SOD PHOS & ACET 6 (3-3) MG/ML IJ SUSP
12.0000 mg | Freq: Once | INTRAMUSCULAR | Status: AC
  Administered 2020-11-09: 12 mg via INTRAMUSCULAR
  Filled 2020-11-08: qty 5

## 2020-11-08 MED ORDER — LABETALOL HCL 5 MG/ML IV SOLN
20.0000 mg | INTRAVENOUS | Status: DC | PRN
  Administered 2020-11-08: 20 mg via INTRAVENOUS

## 2020-11-08 MED ORDER — MAGNESIUM SULFATE BOLUS VIA INFUSION
4.0000 g | Freq: Once | INTRAVENOUS | Status: AC
  Administered 2020-11-08: 4 g via INTRAVENOUS
  Filled 2020-11-08: qty 1000

## 2020-11-08 MED ORDER — LABETALOL HCL 5 MG/ML IV SOLN
80.0000 mg | INTRAVENOUS | Status: DC | PRN
Start: 2020-11-08 — End: 2020-11-08

## 2020-11-08 MED ORDER — BETAMETHASONE SOD PHOS & ACET 6 (3-3) MG/ML IJ SUSP: 12.0000 mg | INTRAMUSCULAR | Status: DC

## 2020-11-08 MED ORDER — LABETALOL HCL 5 MG/ML IV SOLN: 40.0000 mg | INTRAVENOUS | Status: DC | PRN

## 2020-11-08 MED ORDER — MAGNESIUM SULFATE 40 GM/1000ML IV SOLN
INTRAVENOUS | Status: AC
  Filled 2020-11-08: qty 1000

## 2020-11-08 MED ORDER — HYDRALAZINE HCL 20 MG/ML IJ SOLN: 10.0000 mg | INTRAMUSCULAR | Status: DC | PRN

## 2020-11-08 MED ORDER — DOCUSATE SODIUM 100 MG PO CAPS
100.0000 mg | ORAL_CAPSULE | Freq: Every day | ORAL | Status: DC
  Administered 2020-11-10: 100 mg via ORAL
  Filled 2020-11-08: qty 1

## 2020-11-08 MED ORDER — LACTATED RINGERS IV SOLN: INTRAVENOUS | Status: DC

## 2020-11-08 MED ORDER — LABETALOL HCL 5 MG/ML IV SOLN: 20.0000 mg | INTRAVENOUS | Status: DC | PRN

## 2020-11-08 MED ORDER — ACETAMINOPHEN 325 MG PO TABS
650.0000 mg | ORAL_TABLET | ORAL | Status: DC | PRN
  Administered 2020-11-08: 650 mg via ORAL
  Filled 2020-11-08: qty 2

## 2020-11-08 MED ORDER — LABETALOL HCL 5 MG/ML IV SOLN
20.0000 mg | INTRAVENOUS | Status: DC | PRN
Start: 2020-11-08 — End: 2020-11-11

## 2020-11-08 MED ORDER — BETAMETHASONE SOD PHOS & ACET 6 (3-3) MG/ML IJ SUSP
INTRAMUSCULAR | Status: AC
  Administered 2020-11-08: 12 mg via INTRAMUSCULAR
  Filled 2020-11-08: qty 5

## 2020-11-08 MED ORDER — MAGNESIUM SULFATE 40 GM/1000ML IV SOLN: 2.0000 g/h | INTRAVENOUS | Status: DC

## 2020-11-08 NOTE — H&P (Signed)
Maureen Johnston is a 35 y.o. female [id number] transferred from [facility] for inpatient management of severe preeclampsia. Patient presented with headache and blurry vision as well as generalized swelling. Patient was found to have severe range blood pressure and transaminitis. Currently patient reports feeling better and denies any headaches. She reports good fetal movements. She denies vaginal bleeding or leakage of fluid.   OB History     Gravida  5   Para  3   Term  3   Preterm  0   AB  1   Living  3      SAB  1   IAB  0   Ectopic  0   Multiple  0   Live Births  3          Past Medical History:  Diagnosis Date   Medical history non-contributory    Patient denies medical problems    Preeclampsia, severe 11/08/2020   Past Surgical History:  Procedure Laterality Date   denies surgical history     Family History: family history includes Diabetes in her paternal grandmother; Heart disease in her brother. Social History:  reports that she has never smoked. She has never used smokeless tobacco. She reports that she does not drink alcohol and does not use drugs.     Maternal Diabetes: No Genetic Screening: Normal Maternal Ultrasounds/Referrals: Normal Fetal Ultrasounds or other Referrals:  None Maternal Substance Abuse:  No Significant Maternal Medications:  None Significant Maternal Lab Results:  None Other Comments:  None  Review of Systems See pertinent in HPI. All other systems reviewed and non contributory History   Blood pressure (!) 145/89, pulse 68, temperature 98.1 F (36.7 C), temperature source Oral, resp. rate 18, height 4\' 10"  (1.473 m), weight 79.4 kg, last menstrual period 04/16/2020, SpO2 96 %. Exam Physical Exam  GENERAL: Well-developed, well-nourished female in no acute distress.  LUNGS: Clear to auscultation bilaterally.  HEART: Regular rate and rhythm. ABDOMEN: Soft, nontender, gravid PELVIC: Not indicated EXTREMITIES: No cyanosis,  clubbing, or edema, 2+ distal pulses.  FHT: baseline 120, mod variability, no accels, occasional variable decels Toco: no contractions  Prenatal labs: ABO, Rh: O/Positive/-- (03/18 1053) Antibody: Negative (03/18 1053) Rubella: 1.86 (03/18 1053) RPR: Non Reactive (03/18 1053)  HBsAg: Negative (03/18 1053)  HIV:    GBS:     Assessment/Plan: 35 yo P3 at [redacted]w[redacted]d with severe preeclampsia - Continue magnesium sulfate for seizure prophylaxis - Will repeat labs  - Patient to receive second dose of BMZ tomorrow  - Growth ultrasound ordered  - Category 2 tracing - NICU consult requested - Will monitor closely  Cataldo Cosgriff 11/08/2020, 7:06 PM

## 2020-11-08 NOTE — Progress Notes (Signed)
Patient ID: Maureen Johnston, female   DOB: 1986-03-31, 35 y.o.   MRN: [id number] Preeclamsia with severe features recently admitted . Magnesium started and Betamethasone given  Transaminases elevated AST ( 365 , ALT 496). Labetalol 20 mg IV given with good response  CNM will call MFM for Transfer to tertiary center

## 2020-11-08 NOTE — Progress Notes (Signed)
Patient here at [redacted]w[redacted]d for BP recheck.   BP remains elevated, today 150/99. Patient now reports a headache since last night, remains unrelieved this morning. Patient reports that on Sunday she had an episode of flashes in her vision but went away after a few minutes. She also reports a tingling feeling in her hands but no increase swelling.   Urine dip done today = 1+ protein.   Patient reports positive FM. Denies epigastric pain, and denies vision disturbances at the moment.  Floy Sabina, RN

## 2020-11-08 NOTE — OB Triage Note (Signed)
Pt transported via [facility] to [facility]. Pt stable at time of transport. Last BP 154/95- 40 mg labetalol given prior. CNM and MD aware.

## 2020-11-08 NOTE — Progress Notes (Signed)
   PRENATAL VISIT NOTE  Subjective:  Maureen Johnston is a 35 y.o. [phone] at [redacted]w[redacted]d being seen today for ongoing prenatal care.  She is currently monitored for the following issues for this high-risk pregnancy and has Supervision of high risk pregnancy, antepartum; Maternal obesity, antepartum BMI=36.6; Advanced maternal age, primigravida, antepartum 35 yo; and Elevated blood pressure affecting pregnancy, antepartum on their problem list.  Patient reports headache.  Contractions: Not present. Vag. Bleeding: None.  Movement: (!) Decreased. Denies leaking of fluid/ROM.   The following portions of the patient's history were reviewed and updated as appropriate: allergies, current medications, past family history, past medical history, past social history, past surgical history and problem list. Problem list updated.  Objective:   Vitals:   11/08/20 0951  BP: (!) 150/99  Temp: 97.7 F (36.5 C)  Weight: 175 lb 3.2 oz (79.5 kg)    Fetal Status: Fetal Heart Rate (bpm): 150 Fundal Height: 26 cm Movement: (!) Decreased     General:  Alert, oriented and cooperative. Patient is in no acute distress.  Skin: Skin is warm and dry. No rash noted.   Cardiovascular: Normal heart rate noted  Respiratory: Normal respiratory effort, no problems with respiration noted  Abdomen: Soft, gravid, appropriate for gestational age.  Pain/Pressure: Absent     Pelvic: Cervical exam deferred        Extremities: Normal range of motion.  Edema: Trace  Mental Status: Normal mood and affect. Normal behavior. Normal judgment and thought content.   Assessment and Plan:  Pregnancy: [id number] at [redacted]w[redacted]d  1. Elevated blood pressure affecting pregnancy, antepartum 150/99 with 1+ proteinuria and h/a today onset last night  with scotoma 7/10-/22 and decreased fetal movement since yesterday 11/04/20 BP=148/99 and 148/98 with 1+ proteinuria and uric acid=7.4 and pt was sent home to monitor sxs Report given to Dr. Feliberto Gottron and  pt sent to ER now for evaluation Growth u/s 12/20/20. 1 hour glucola=105 on 07/15/20. Taking ASA 81 mg daily - Urinalysis (Urine Dip)  2. Supervision of high risk pregnancy, antepartum Not working  3. Maternal obesity, antepartum 40 lb 3.2 oz (18.2 kg) Taking ASA 81 mg daily  4. Advanced maternal age, primigravida, antepartum 35 yo Taking ASA 81 mg daily   Preterm labor symptoms and general obstetric precautions including but not limited to vaginal bleeding, contractions, leaking of fluid and fetal movement were reviewed in detail with the patient. Please refer to After Visit Summary for other counseling recommendations.  Return in about 2 days (around 11/10/2020).  Future Appointments  Date Time Provider Department Center  11/10/2020  9:00 AM [facility] PROVIDER [facility] None  11/18/2020 10:00 AM [facility] PROVIDER [facility] None  12/20/2020 11:00 AM [facility]    Alberteen Spindle, CNM

## 2020-11-08 NOTE — OB Triage Note (Signed)
CNM at bedside to evaluate patient and discuss transfer. Report given to receiving nurse and transport.  See EMTALA for transfer info.

## 2020-11-08 NOTE — Progress Notes (Signed)
Maureen Johnston is a 35 y.o. [phone] at [redacted]w[redacted]d by admitted for   Subjective: Pulse ox reading   Objective: BP (!) 162/98   Pulse 74   Temp 98.2 F (36.8 C) (Oral)   Resp 20   Ht 4\' 10"  (1.473 m)   Wt 79.4 kg   LMP 04/16/2020 (Exact Date)   SpO2 93%   BMI 36.58 kg/m  No intake/output data recorded. Total I/O In: 188 [I.V.:188] Out: 0   FHT:   UC:    SVE:      Labs: Lab Results  Component Value Date   WBC 7.7 11/08/2020   HGB 13.6 11/08/2020   HCT 39.6 11/08/2020   MCV 90.8 11/08/2020   PLT 185 11/08/2020    Assessment / Plan: Severe preeclampsia  Labor:  Preeclampsia:   Fetal Wellbeing:   Pain Control:   I/D:   Anticipated MOD:    01/09/2021 Maureen Johnston 11/08/2020, 5:33 PM

## 2020-11-08 NOTE — H&P (Signed)
HISTORY AND PHYSICAL NOTE  History of Present Illness: Maureen Johnston is a 35 y.o. [phone] at [redacted]w[redacted]d admitted for evaluation of headache, blurred vision with visual spots, and reports of generalized swelling; found to be pre-eclampsia with severe features.     Patient reports the fetal movement as active.  Patient reports uterine contraction  activity as none. Patient reports  vaginal bleeding as none. Patient describes fluid per vagina as None. Fetal presentation is  unknown at this time bedside u/s not available .  Patient Active Problem List   Diagnosis Date Noted   Elevated blood pressure affecting pregnancy in second trimester, antepartum 11/08/2020   Preeclampsia, severe 11/08/2020   Elevated blood pressure affecting pregnancy, antepartum 11/06/2020   Supervision of high risk pregnancy, antepartum 07/15/2020   Maternal obesity, antepartum BMI=36.6 07/15/2020   Advanced maternal age, primigravida, antepartum 35 yo 07/15/2020    Past Medical History:  Diagnosis Date   Medical history non-contributory    Patient denies medical problems    Preeclampsia, severe 11/08/2020    Past Surgical History:  Procedure Laterality Date   denies surgical history      OB History  Gravida Para Term Preterm AB Living  5 3 3  0 1 3  SAB IAB Ectopic Multiple Live Births  1 0 0 0 3    # Outcome Date GA Lbr Len/2nd Weight Sex Delivery Anes PTL Lv  5 Current           4 Term 12/25/13   3840 g M Vag-Spont   LIV  3 Term 11/14/04   3500 g F Vag-Spont   LIV  2 Term 12/02/98   3250 g M Vag-Spont   LIV  1 SAB             Social History   Socioeconomic History   Marital status: Single    Spouse name: Not on file   Number of children: 3   Years of education: 8   Highest education level: 8th grade  Occupational History   Occupation: Food Prep  Tobacco Use   Smoking status: Never   Smokeless tobacco: Never   Tobacco comments:    denies secondhand smoke  Vaping Use   Vaping Use:  Never used  Substance and Sexual Activity   Alcohol use: Never   Drug use: Never   Sexual activity: Yes    Birth control/protection: None    Comment: hx ocp  Other Topics Concern   Not on file  Social History Narrative   Not on file   Social Determinants of Health   Financial Resource Strain: Medium Risk   Difficulty of Paying Living Expenses: Somewhat hard  Food Insecurity: No Food Insecurity   Worried About 02/01/99 in the Last Year: Never true   Ran Out of Food in the Last Year: Never true  Transportation Needs: No Transportation Needs   Lack of Transportation (Medical): No   Lack of Transportation (Non-Medical): No  Physical Activity: Not on file  Stress: Not on file  Social Connections: Not on file    Family History  Problem Relation Age of Onset   Diabetes Paternal Grandmother    Heart disease Brother     No Known Allergies  Medications Prior to Admission  Medication Sig Dispense Refill Last Dose   aspirin EC 81 MG tablet Take 1 tablet (81 mg total) by mouth daily. Take after 12 weeks for prevention of preeclampsia later in pregnancy 300 tablet 2 11/08/2020  Prenatal Vit-Fe Fumarate-FA (PRENATAL MULTIVITAMIN) TABS tablet Take 1 tablet by mouth daily at 12 noon. 100 tablet 0 11/08/2020    Review of Systems - Negative except as noted above. Information obtained from patient.  Vitals:  BP 135/84   Pulse 70   Temp 98.2 F (36.8 C) (Oral)   Resp 16   Ht 4\' 10"  (1.473 m)   Wt 79.4 kg   LMP 04/16/2020 (Exact Date)   SpO2 93%   BMI 36.58 kg/m  Physical Examination: Physical Exam HENT:     Head: Normocephalic and atraumatic.  Cardiovascular:     Rate and Rhythm: Normal rate.  Pulmonary:     Effort: Pulmonary effort is normal.     Breath sounds: Normal breath sounds.  Neurological:     Mental Status: She is alert.   Cervix: Not evaluated.  Membranes:intact  Fetal Monitoring: FHR Category II  Tocometer: None, soft resting tone  Labs:   Results for orders placed or performed during the hospital encounter of 11/08/20 (from the past 24 hour(s))  Protein / creatinine ratio, urine   Collection Time: 11/08/20  2:17 PM  Result Value Ref Range   Creatinine, Urine 30 mg/dL   Total Protein, Urine 11 mg/dL   Protein Creatinine Ratio 0.37 (H) 0.00 - 0.15 mg/mg[Cre]  Comprehensive metabolic panel   Collection Time: 11/08/20  2:17 PM  Result Value Ref Range   Sodium 134 (L) 135 - 145 mmol/L   Potassium 3.8 3.5 - 5.1 mmol/L   Chloride 106 98 - 111 mmol/L   CO2 22 22 - 32 mmol/L   Glucose, Bld 120 (H) 70 - 99 mg/dL   BUN 15 6 - 20 mg/dL   Creatinine, Ser 01/09/21 0.44 - 1.00 mg/dL   Calcium 8.7 (L) 8.9 - 10.3 mg/dL   Total Protein 6.6 6.5 - 8.1 g/dL   Albumin 2.9 (L) 3.5 - 5.0 g/dL   AST 5.40 (H) 15 - 41 U/L   ALT 496 (H) 0 - 44 U/L   Alkaline Phosphatase 125 38 - 126 U/L   Total Bilirubin 0.7 0.3 - 1.2 mg/dL   GFR, Estimated 086 >76 mL/min   Anion gap 6 5 - 15  CBC   Collection Time: 11/08/20  2:17 PM  Result Value Ref Range   WBC 7.7 4.0 - 10.5 K/uL   RBC 4.36 3.87 - 5.11 MIL/uL   Hemoglobin 13.6 12.0 - 15.0 g/dL   HCT 01/09/21 50.9 - 32.6 %   MCV 90.8 80.0 - 100.0 fL   MCH 31.2 26.0 - 34.0 pg   MCHC 34.3 30.0 - 36.0 g/dL   RDW 71.2 45.8 - 09.9 %   Platelets 185 150 - 400 K/uL   nRBC 0.3 (H) 0.0 - 0.2 %  Sample to Blood Bank   Collection Time: 11/08/20  2:17 PM  Result Value Ref Range   Blood Bank Specimen SAMPLE AVAILABLE FOR TESTING    Sample Expiration      11/11/2020,2359 Performed at [facility], [address] [place], [place]   Results for orders placed or performed in visit on 11/08/20 (from the past 24 hour(s))  Urinalysis (Urine Dip)   Collection Time: 11/08/20 10:44 AM  Result Value Ref Range   Specific Gravity, UA 1.015 1.005 - 1.030   pH, UA 5.5 5.0 - 7.5   Color, UA Yellow Yellow   Appearance Ur Clear Clear   Leukocytes,UA 1+ (A) Negative   Protein,UA 1+ (A) Negative/Trace  Glucose, UA Negative Negative   Ketones, UA Negative Negative   RBC, UA Negative Negative   Bilirubin, UA Negative Negative   Urobilinogen, Ur 0.2 0.2 - 1.0 mg/dL   Nitrite, UA Negative Negative    Imaging Studies: No results found.  Assessment and Plan: Patient Active Problem List   Diagnosis Date Noted   Elevated blood pressure affecting pregnancy in second trimester, antepartum 11/08/2020   Preeclampsia, severe 11/08/2020   Elevated blood pressure affecting pregnancy, antepartum 11/06/2020   Supervision of high risk pregnancy, antepartum 07/15/2020   Maternal obesity, antepartum BMI=36.6 07/15/2020   Advanced maternal age, primigravida, antepartum 35 yo 07/15/2020   Admit to Antenatal Betamethasone x 2 doses, first dose at 1458 Magnesium sulfate for seizure prevention Dr. Feliberto Gottron in unit aware of patient, recommends transfer to higher level of care Report called to [facility], [facility]; patient accepted   Chari Manning Certified Nurse Midwife [facility]  11/08/20 3:52 PM

## 2020-11-09 ENCOUNTER — Inpatient Hospital Stay (HOSPITAL_COMMUNITY): Payer: Medicaid Other | Admitting: Anesthesiology

## 2020-11-09 ENCOUNTER — Encounter (HOSPITAL_COMMUNITY): Payer: Self-pay | Admitting: Family Medicine

## 2020-11-09 ENCOUNTER — Encounter (HOSPITAL_COMMUNITY)
Admission: AD | Disposition: A | Discharge status: Home / Self Care | Payer: Self-pay | Source: Home / Self Care | Attending: Family Medicine

## 2020-11-09 ENCOUNTER — Inpatient Hospital Stay (HOSPITAL_BASED_OUTPATIENT_CLINIC_OR_DEPARTMENT_OTHER): Payer: Medicaid Other

## 2020-11-09 DIAGNOSIS — O1412 Severe pre-eclampsia, second trimester: Secondary | ICD-10-CM

## 2020-11-09 DIAGNOSIS — O99212 Obesity complicating pregnancy, second trimester: Secondary | ICD-10-CM

## 2020-11-09 DIAGNOSIS — O321XX0 Maternal care for breech presentation, not applicable or unspecified: Secondary | ICD-10-CM

## 2020-11-09 DIAGNOSIS — Z3A26 26 weeks gestation of pregnancy: Secondary | ICD-10-CM

## 2020-11-09 DIAGNOSIS — O1414 Severe pre-eclampsia complicating childbirth: Secondary | ICD-10-CM

## 2020-11-09 DIAGNOSIS — E669 Obesity, unspecified: Secondary | ICD-10-CM

## 2020-11-09 DIAGNOSIS — O4593 Premature separation of placenta, unspecified, third trimester: Secondary | ICD-10-CM

## 2020-11-09 DIAGNOSIS — O09522 Supervision of elderly multigravida, second trimester: Secondary | ICD-10-CM

## 2020-11-09 DIAGNOSIS — R7401 Elevation of levels of liver transaminase levels: Secondary | ICD-10-CM | POA: Diagnosis present

## 2020-11-09 DIAGNOSIS — O1410 Severe pre-eclampsia, unspecified trimester: Secondary | ICD-10-CM | POA: Diagnosis present

## 2020-11-09 LAB — COMPREHENSIVE METABOLIC PANEL
ALT: 604 U/L — ABNORMAL HIGH (ref 0–44)
ALT: 550 U/L — ABNORMAL HIGH (ref 0–44)
AST: 504 U/L — ABNORMAL HIGH (ref 15–41)
AST: 418 U/L — ABNORMAL HIGH (ref 15–41)
Albumin: 2.5 g/dL — ABNORMAL LOW (ref 3.5–5.0)
Albumin: 2.4 g/dL — ABNORMAL LOW (ref 3.5–5.0)
Alkaline Phosphatase: 109 U/L (ref 38–126)
Alkaline Phosphatase: 106 U/L (ref 38–126)
Anion gap: 9 (ref 5–15)
Anion gap: 7 (ref 5–15)
BUN: 14 mg/dL (ref 6–20)
BUN: 18 mg/dL (ref 6–20)
CO2: 19 mmol/L — ABNORMAL LOW (ref 22–32)
CO2: 19 mmol/L — ABNORMAL LOW (ref 22–32)
Calcium: 7.1 mg/dL — ABNORMAL LOW (ref 8.9–10.3)
Calcium: 5.9 mg/dL — CL (ref 8.9–10.3)
Chloride: 103 mmol/L (ref 98–111)
Chloride: 103 mmol/L (ref 98–111)
Creatinine, Ser: 0.88 mg/dL (ref 0.44–1.00)
Creatinine, Ser: 0.83 mg/dL (ref 0.44–1.00)
GFR, Estimated: >60 mL/min (ref >60)
GFR, Estimated: >60 mL/min (ref >60)
Glucose, Bld: 125 mg/dL — ABNORMAL HIGH (ref 70–99)
Glucose, Bld: 127 mg/dL — ABNORMAL HIGH (ref 70–99)
Potassium: 4.4 mmol/L (ref 3.5–5.1)
Potassium: 4.8 mmol/L (ref 3.5–5.1)
Sodium: 131 mmol/L — ABNORMAL LOW (ref 135–145)
Sodium: 129 mmol/L — ABNORMAL LOW (ref 135–145)
Total Bilirubin: 0.7 mg/dL (ref 0.3–1.2)
Total Bilirubin: 0.3 mg/dL (ref 0.3–1.2)
Total Protein: 6.3 g/dL — ABNORMAL LOW (ref 6.5–8.1)
Total Protein: 5.9 g/dL — ABNORMAL LOW (ref 6.5–8.1)

## 2020-11-09 LAB — MAGNESIUM: Magnesium: 7.7 mg/dL (ref 1.7–2.4)

## 2020-11-09 LAB — CBC
HCT: 40.7 % (ref 36.0–46.0)
HCT: 42 % (ref 36.0–46.0)
HCT: 37.4 % (ref 36.0–46.0)
Hemoglobin: 13.5 g/dL (ref 12.0–15.0)
Hemoglobin: 14.1 g/dL (ref 12.0–15.0)
Hemoglobin: 12.1 g/dL (ref 12.0–15.0)
MCH: 30.1 pg (ref 26.0–34.0)
MCH: 30.7 pg (ref 26.0–34.0)
MCH: 30 pg (ref 26.0–34.0)
MCHC: 33.2 g/dL (ref 30.0–36.0)
MCHC: 33.6 g/dL (ref 30.0–36.0)
MCHC: 32.4 g/dL (ref 30.0–36.0)
MCV: 90.6 fL (ref 80.0–100.0)
MCV: 91.3 fL (ref 80.0–100.0)
MCV: 92.8 fL (ref 80.0–100.0)
Platelets: 201 10*3/uL (ref 150–400)
Platelets: 213 10*3/uL (ref 150–400)
Platelets: 212 10*3/uL (ref 150–400)
RBC: 4.49 MIL/uL (ref 3.87–5.11)
RBC: 4.6 MIL/uL (ref 3.87–5.11)
RBC: 4.03 MIL/uL (ref 3.87–5.11)
RDW: 15.6 % — ABNORMAL HIGH (ref 11.5–15.5)
RDW: 15.8 % — ABNORMAL HIGH (ref 11.5–15.5)
RDW: 15.9 % — ABNORMAL HIGH (ref 11.5–15.5)
WBC: 9.6 10*3/uL (ref 4.0–10.5)
WBC: 12.1 10*3/uL — ABNORMAL HIGH (ref 4.0–10.5)
WBC: 16.7 10*3/uL — ABNORMAL HIGH (ref 4.0–10.5)
nRBC: 0.5 % — ABNORMAL HIGH (ref 0.0–0.2)
nRBC: 0.2 % (ref 0.0–0.2)
nRBC: 0.1 % (ref 0.0–0.2)

## 2020-11-09 LAB — APTT: aPTT: 32 seconds (ref 24–36)

## 2020-11-09 LAB — FIBRINOGEN: Fibrinogen: 384 mg/dL (ref 210–475)

## 2020-11-09 LAB — PROTIME-INR
INR: 1 (ref 0.8–1.2)
Prothrombin Time: 13.1 seconds (ref 11.4–15.2)

## 2020-11-09 IMAGING — US US MFM OB FOLLOW-UP
1 series · 14 of 28 positions shown · non-contrast
Comparison: none

[Series 1: us mfm ob follow-up · 14 of 42 slices shown]
[im 2/42]
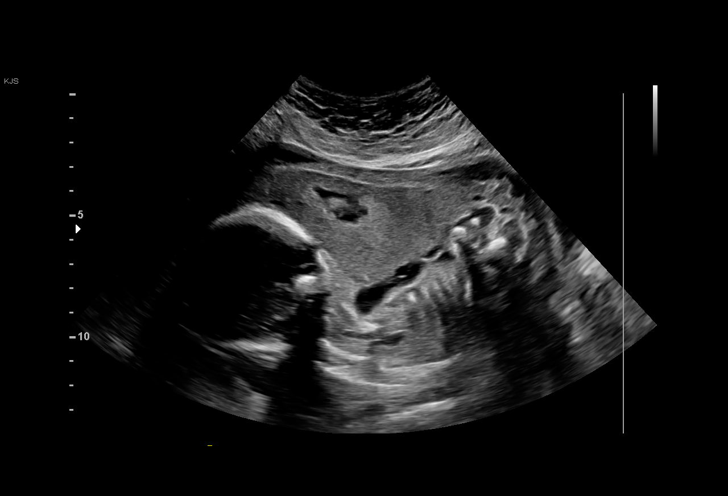
[im 5/42]
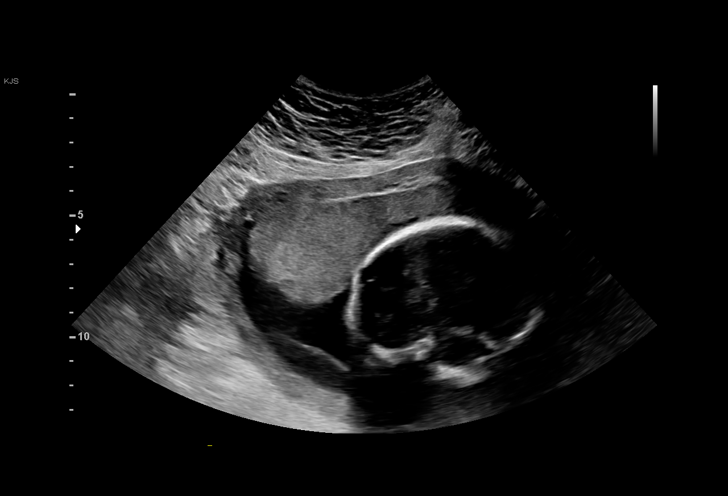
[im 8/42]
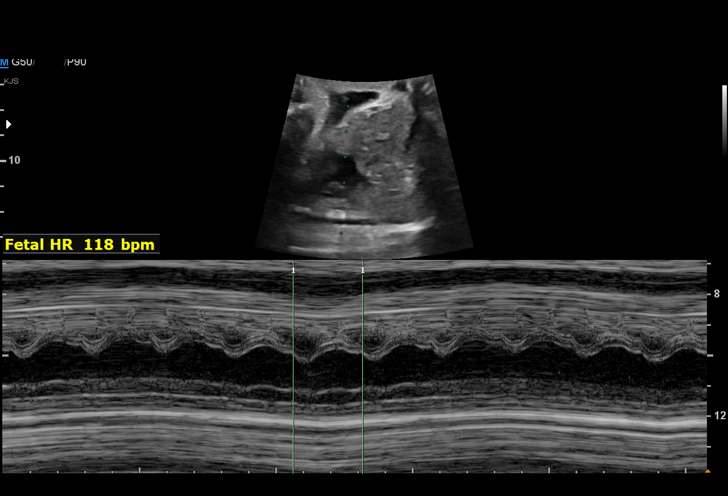
[im 11/42]
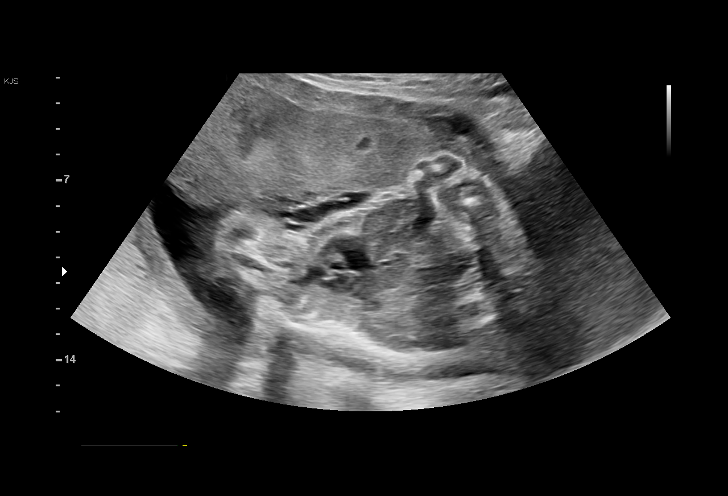
[im 14/42]
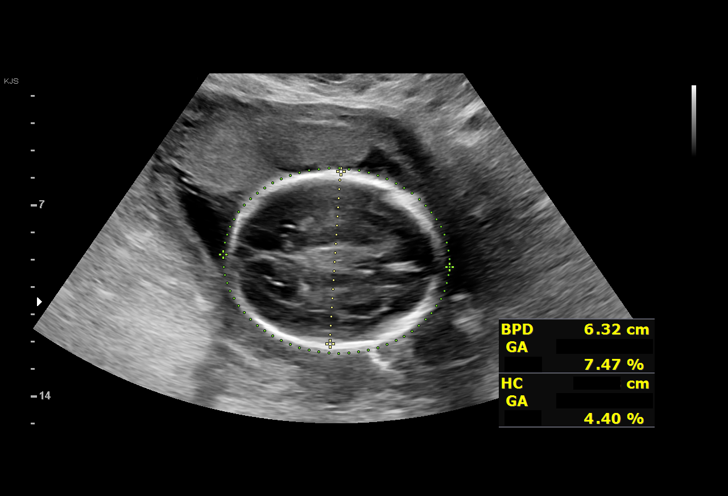
[im 17/42]
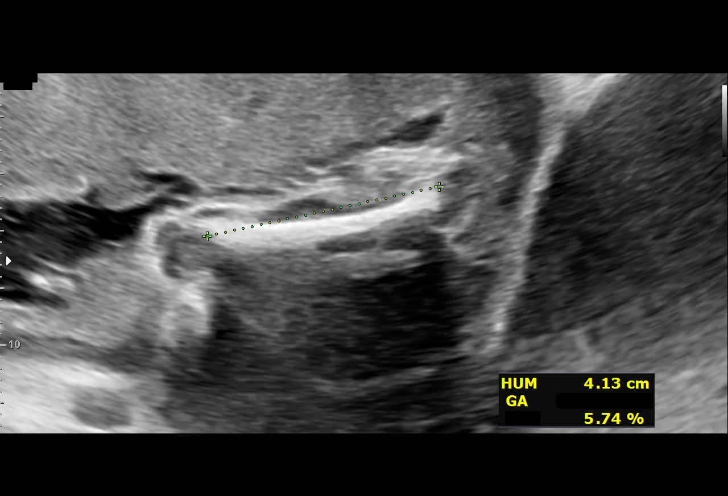
[im 20/42]
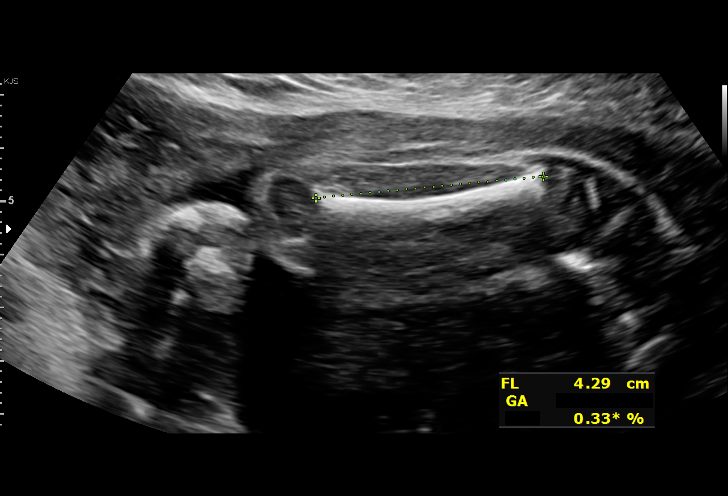
[im 23/42]
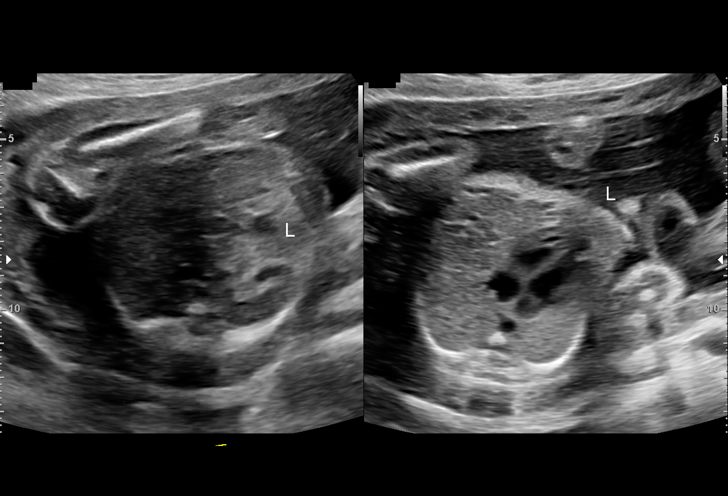
[im 26/42]
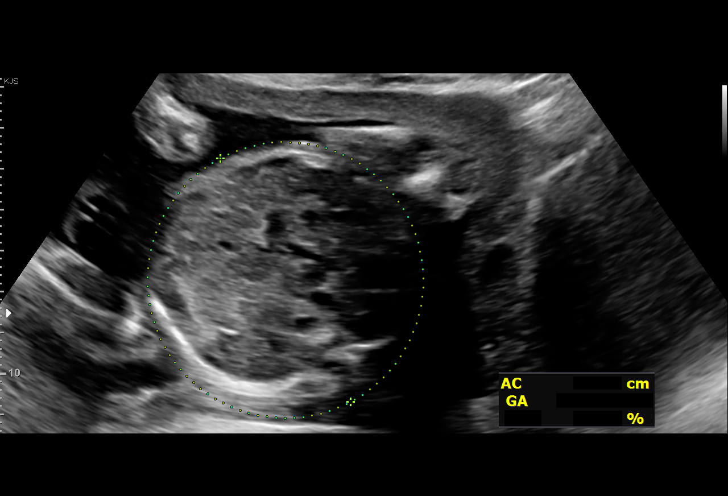
[im 29/42]
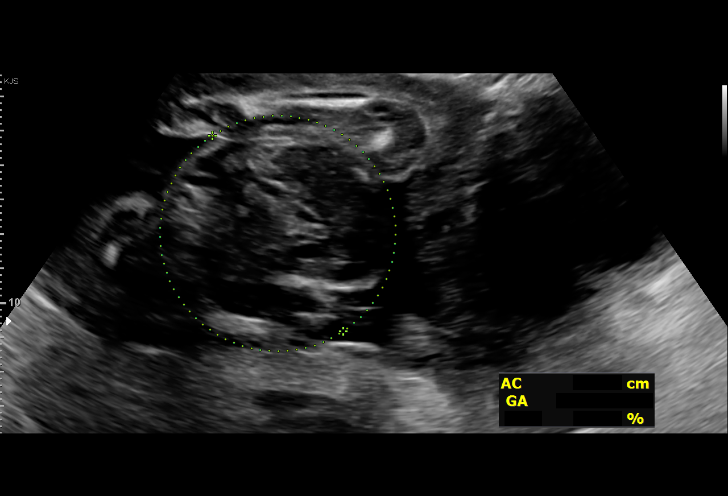
[im 32/42]
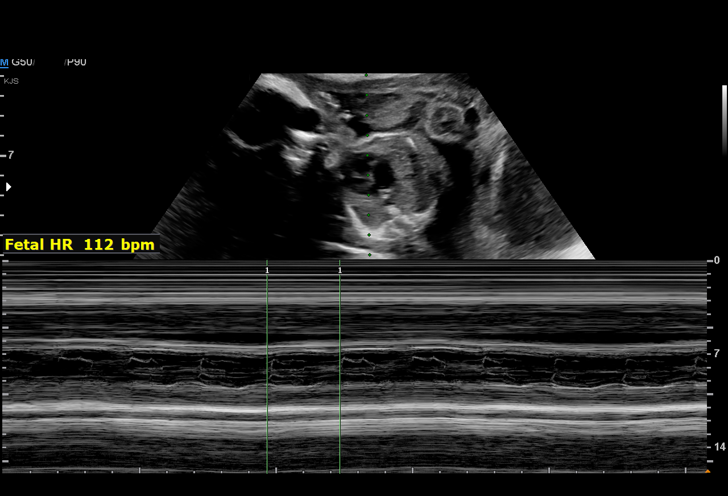
[im 35/42]
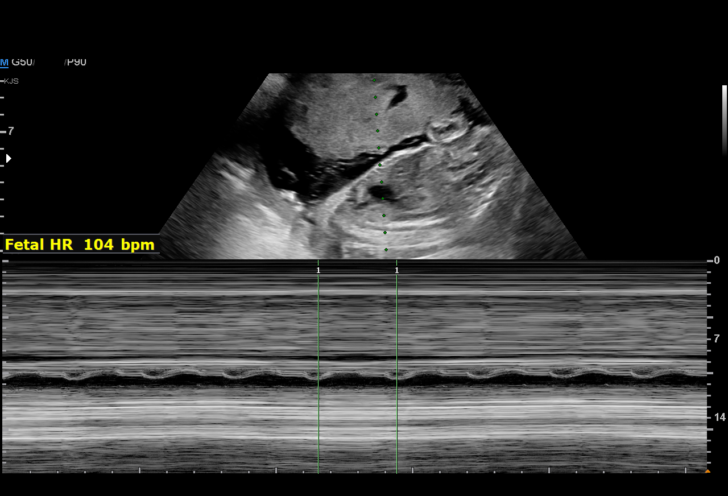
[im 38/42]
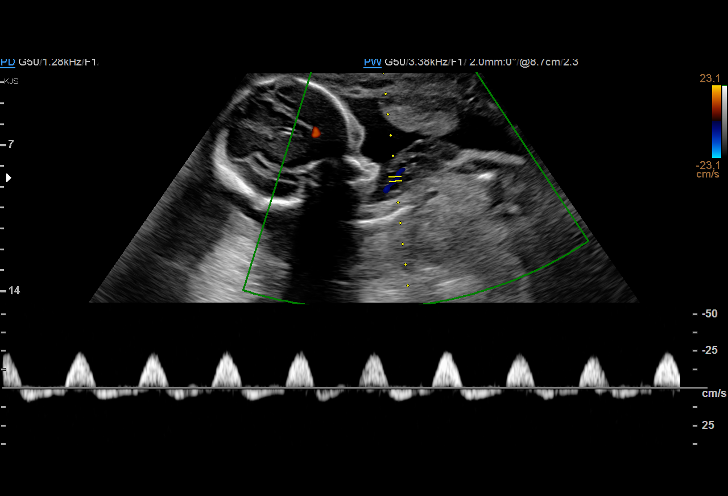
[im 42/42]
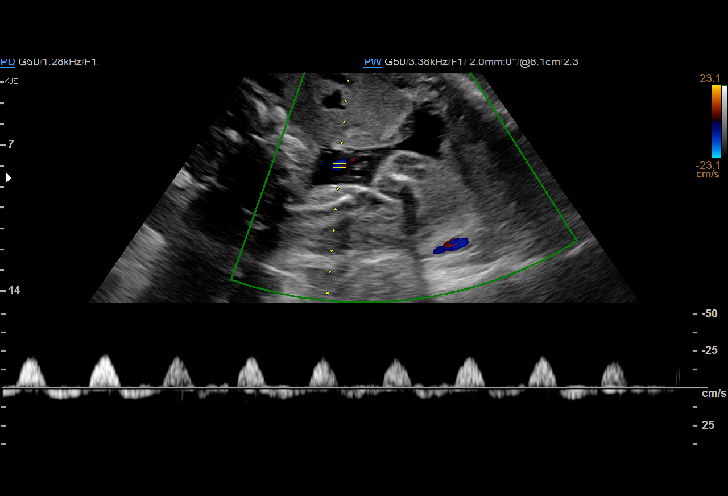

[14 of 28 positions shown; findings below may reference images not displayed]

Indications

 26 weeks gestation of pregnancy
 Advanced maternal age multigravida 35+,
 second trimester
 Obesity complicating pregnancy, second
 trimester
 Severe preeclampsia, second trimester
Fetal Evaluation

 Num Of Fetuses:         1
 Fetal Heart Rate(bpm):  108
 Cardiac Activity:       Observed
 Presentation:           Breech
 Placenta:               Anterior
 P. Cord Insertion:      Previously Visualized

 Amniotic Fluid
 AFI FV:      Subjectively decreased

                             Largest Pocket(cm)
                             3.
Biometry

 BPD:      63.4  mm     G. Age:  25w 5d          9  %    CI:        71.73   %    70 - 86
                                                         FL/HC:      18.3   %    18.6 -
 HC:      238.3  mm     G. Age:  25w 6d        4.9  %    HC/AC:      1.10        1.05 -
 AC:      216.4  mm     G. Age:  26w 1d         21  %    FL/BPD:     68.8   %    71 - 87
 FL:       43.6  mm     G. Age:  24w 2d        < 1  %    FL/AC:      20.1   %    20 - 24
 HUM:      40.8  mm     G. Age:  24w 5d        < 5  %
 LV:        6.4  mm

 Est. FW:     809  gm    1 lb 13 oz     4.3  %
Gestational Age

 LMP:           29w 4d        Date:  04/16/20                 EDD:   01/21/21
 U/S Today:     25w 4d                                        EDD:   02/18/21
 Best:          26w 6d     Det. By:  U/S  (08/25/20)          EDD:   02/09/21
Anatomy

 Cranium:               Appears normal         Aortic Arch:            Previously seen
 Cavum:                 Appears normal         Ductal Arch:            Previously seen
 Ventricles:            Appears normal         Diaphragm:              Appears normal
 Choroid Plexus:        Previously seen        Stomach:                Not well visualized
 Cerebellum:            Previously seen        Abdomen:                Previously seen
 Posterior Fossa:       Previously seen        Abdominal Wall:         Previously seen
 Nuchal Fold:           Not applicable (>20    Cord Vessels:           Previously seen
                        wks GA)
 Face:                  Orbits and profile     Kidneys:                Appear normal
                        previously seen
 Lips:                  Previously seen        Bladder:                Appears normal
 Thoracic:              Previously seen        Spine:                  Previously seen
 Heart:                 Previously seen        Upper Extremities:      Previously seen
 RVOT:                  Previously seen        Lower Extremities:      Previously seen
 LVOT:                  Previously seen

 Other:  Stomach not visualized
Doppler - Fetal Vessels

 Umbilical Artery
                                                             ADFV    RDFV
                                                                No     Yes

Impression

 G5 P3.  Patient was transferred from [HOSPITAL] with diagnosis of
 preeclampsia with severe features and HELLPsyndrome.

 On ultrasound, the estimated fetal weight was at the 4th
 percentile.  Maximum vertical pocket of amniotic fluid
 measured 3 cm (subjectively decreased).  Fetal heart rate
 ranged from 104 212/minute.  Fetal anatomical survey was
 still very limited but no obvious structural defects were seen.
 Umbilical artery Doppler showed reversed end-diastolic flow.

 Patient had received the second dose of corticosteroids.
 Discussed with Dr. Odirile Masala.
                 Humanes, [place]

## 2020-11-09 SURGERY — Surgical Case
Anesthesia: Spinal

## 2020-11-09 MED ORDER — OXYTOCIN-SODIUM CHLORIDE 30-0.9 UT/500ML-% IV SOLN
INTRAVENOUS | Status: DC | PRN
  Administered 2020-11-09: 300 mL via INTRAVENOUS

## 2020-11-09 MED ORDER — OXYCODONE HCL 5 MG PO TABS: 5.0000 mg | ORAL_TABLET | ORAL | Status: DC | PRN

## 2020-11-09 MED ORDER — PHENYLEPHRINE HCL-NACL 20-0.9 MG/250ML-% IV SOLN
INTRAVENOUS | Status: DC | PRN
  Administered 2020-11-09: 30 ug/min via INTRAVENOUS

## 2020-11-09 MED ORDER — NALBUPHINE HCL 10 MG/ML IJ SOLN: 5.0000 mg | Freq: Once | INTRAMUSCULAR | Status: DC | PRN

## 2020-11-09 MED ORDER — ONDANSETRON HCL 4 MG/2ML IJ SOLN
INTRAMUSCULAR | Status: AC
  Filled 2020-11-09: qty 2

## 2020-11-09 MED ORDER — LACTATED RINGERS IV BOLUS
500.0000 mL | Freq: Once | INTRAVENOUS | Status: AC
  Administered 2020-11-09: 500 mL via INTRAVENOUS

## 2020-11-09 MED ORDER — SOD CITRATE-CITRIC ACID 500-334 MG/5ML PO SOLN
ORAL | Status: AC
  Administered 2020-11-09: 30 mL
  Filled 2020-11-09: qty 30

## 2020-11-09 MED ORDER — DIPHENHYDRAMINE HCL 25 MG PO CAPS: 25.0000 mg | ORAL_CAPSULE | ORAL | Status: DC | PRN

## 2020-11-09 MED ORDER — NALOXONE HCL 0.4 MG/ML IJ SOLN: 0.4000 mg | INTRAMUSCULAR | Status: DC | PRN

## 2020-11-09 MED ORDER — OXYTOCIN-SODIUM CHLORIDE 30-0.9 UT/500ML-% IV SOLN
INTRAVENOUS | Status: AC
  Filled 2020-11-09: qty 500

## 2020-11-09 MED ORDER — TRANEXAMIC ACID-NACL 1000-0.7 MG/100ML-% IV SOLN
INTRAVENOUS | Status: DC | PRN
  Administered 2020-11-09: 1000 mg via INTRAVENOUS

## 2020-11-09 MED ORDER — SIMETHICONE 80 MG PO CHEW: 80.0000 mg | CHEWABLE_TABLET | ORAL | Status: DC | PRN

## 2020-11-09 MED ORDER — SENNOSIDES-DOCUSATE SODIUM 8.6-50 MG PO TABS
2.0000 | ORAL_TABLET | ORAL | Status: DC
  Administered 2020-11-10 – 2020-11-11 (×2): 2 via ORAL
  Filled 2020-11-09 (×2): qty 2

## 2020-11-09 MED ORDER — MORPHINE SULFATE (PF) 0.5 MG/ML IJ SOLN
INTRAMUSCULAR | Status: DC | PRN
  Administered 2020-11-09: 150 ug via INTRATHECAL

## 2020-11-09 MED ORDER — NALBUPHINE HCL 10 MG/ML IJ SOLN: 5.0000 mg | INTRAMUSCULAR | Status: DC | PRN

## 2020-11-09 MED ORDER — TETANUS-DIPHTH-ACELL PERTUSSIS 5-2.5-18.5 LF-MCG/0.5 IM SUSY: 0.5000 mL | PREFILLED_SYRINGE | Freq: Once | INTRAMUSCULAR | Status: DC

## 2020-11-09 MED ORDER — SODIUM CHLORIDE 0.9 % IV SOLN
25.0000 mg | Freq: Four times a day (QID) | INTRAVENOUS | Status: DC | PRN
  Administered 2020-11-09: 25 mg via INTRAVENOUS
  Filled 2020-11-09: qty 1

## 2020-11-09 MED ORDER — LACTATED RINGERS IV SOLN: INTRAVENOUS | Status: DC | PRN

## 2020-11-09 MED ORDER — DIPHENHYDRAMINE HCL 25 MG PO CAPS: 25.0000 mg | ORAL_CAPSULE | Freq: Four times a day (QID) | ORAL | Status: DC | PRN

## 2020-11-09 MED ORDER — MAGNESIUM SULFATE 40 GM/1000ML IV SOLN: 1.0000 g/h | INTRAVENOUS | Status: DC

## 2020-11-09 MED ORDER — FENTANYL CITRATE (PF) 100 MCG/2ML IJ SOLN: 25.0000 ug | INTRAMUSCULAR | Status: DC | PRN

## 2020-11-09 MED ORDER — LACTATED RINGERS IV SOLN: INTRAVENOUS | Status: DC

## 2020-11-09 MED ORDER — KETOROLAC TROMETHAMINE 30 MG/ML IJ SOLN
30.0000 mg | Freq: Four times a day (QID) | INTRAMUSCULAR | Status: AC
  Administered 2020-11-09 – 2020-11-10 (×4): 30 mg via INTRAVENOUS
  Filled 2020-11-09 (×4): qty 1

## 2020-11-09 MED ORDER — TRANEXAMIC ACID-NACL 1000-0.7 MG/100ML-% IV SOLN
INTRAVENOUS | Status: AC
  Filled 2020-11-09: qty 100

## 2020-11-09 MED ORDER — KETOROLAC TROMETHAMINE 30 MG/ML IJ SOLN
INTRAMUSCULAR | Status: AC
  Filled 2020-11-09: qty 1

## 2020-11-09 MED ORDER — MEASLES, MUMPS & RUBELLA VAC IJ SOLR: 0.5000 mL | Freq: Once | INTRAMUSCULAR | Status: DC

## 2020-11-09 MED ORDER — KETOROLAC TROMETHAMINE 30 MG/ML IJ SOLN: 30.0000 mg | Freq: Four times a day (QID) | INTRAMUSCULAR | Status: AC | PRN

## 2020-11-09 MED ORDER — ONDANSETRON HCL 4 MG/2ML IJ SOLN
INTRAMUSCULAR | Status: DC | PRN
  Administered 2020-11-09: 4 mg via INTRAVENOUS

## 2020-11-09 MED ORDER — NALOXONE HCL 4 MG/10ML IJ SOLN
1.0000 ug/kg/h | INTRAVENOUS | Status: DC | PRN
  Filled 2020-11-09: qty 5

## 2020-11-09 MED ORDER — CEFAZOLIN SODIUM-DEXTROSE 2-4 GM/100ML-% IV SOLN: 2.0000 g | INTRAVENOUS | Status: DC

## 2020-11-09 MED ORDER — SOD CITRATE-CITRIC ACID 500-334 MG/5ML PO SOLN: 30.0000 mL | ORAL | Status: DC

## 2020-11-09 MED ORDER — CEFAZOLIN SODIUM-DEXTROSE 2-4 GM/100ML-% IV SOLN
INTRAVENOUS | Status: AC
  Filled 2020-11-09: qty 100

## 2020-11-09 MED ORDER — CEFAZOLIN SODIUM-DEXTROSE 2-3 GM-%(50ML) IV SOLR
INTRAVENOUS | Status: DC | PRN
  Administered 2020-11-09: 2 g via INTRAVENOUS

## 2020-11-09 MED ORDER — COCONUT OIL OIL
1.0000 "application " | TOPICAL_OIL | Status: DC | PRN
  Administered 2020-11-10: 1 via TOPICAL

## 2020-11-09 MED ORDER — MORPHINE SULFATE (PF) 0.5 MG/ML IJ SOLN
INTRAMUSCULAR | Status: AC
  Filled 2020-11-09: qty 10

## 2020-11-09 MED ORDER — ONDANSETRON HCL 4 MG/2ML IJ SOLN: 4.0000 mg | Freq: Three times a day (TID) | INTRAMUSCULAR | Status: DC | PRN

## 2020-11-09 MED ORDER — DIBUCAINE (PERIANAL) 1 % EX OINT: 1.0000 "application " | TOPICAL_OINTMENT | CUTANEOUS | Status: DC | PRN

## 2020-11-09 MED ORDER — OXYTOCIN-SODIUM CHLORIDE 30-0.9 UT/500ML-% IV SOLN: 2.5000 [IU]/h | INTRAVENOUS | Status: AC

## 2020-11-09 MED ORDER — FENTANYL CITRATE (PF) 100 MCG/2ML IJ SOLN
INTRAMUSCULAR | Status: AC
  Filled 2020-11-09: qty 2

## 2020-11-09 MED ORDER — SODIUM CHLORIDE 0.9% FLUSH: 3.0000 mL | INTRAVENOUS | Status: DC | PRN

## 2020-11-09 MED ORDER — MENTHOL 3 MG MT LOZG: 1.0000 | LOZENGE | OROMUCOSAL | Status: DC | PRN

## 2020-11-09 MED ORDER — KETOROLAC TROMETHAMINE 30 MG/ML IJ SOLN
30.0000 mg | Freq: Once | INTRAMUSCULAR | Status: AC
  Administered 2020-11-09: 30 mg via INTRAVENOUS

## 2020-11-09 MED ORDER — MAGNESIUM SULFATE 40 GM/1000ML IV SOLN
1.0000 g/h | INTRAVENOUS | Status: DC
  Administered 2020-11-09: 2 g/h via INTRAVENOUS
  Filled 2020-11-09: qty 1000

## 2020-11-09 MED ORDER — FENTANYL CITRATE (PF) 100 MCG/2ML IJ SOLN
INTRAMUSCULAR | Status: DC | PRN
  Administered 2020-11-09: 15 ug via INTRATHECAL

## 2020-11-09 MED ORDER — IBUPROFEN 600 MG PO TABS
600.0000 mg | ORAL_TABLET | Freq: Four times a day (QID) | ORAL | Status: DC
  Administered 2020-11-10 – 2020-11-11 (×4): 600 mg via ORAL
  Filled 2020-11-09 (×4): qty 1

## 2020-11-09 MED ORDER — DIPHENHYDRAMINE HCL 50 MG/ML IJ SOLN
12.5000 mg | INTRAMUSCULAR | Status: DC | PRN
  Administered 2020-11-09: 12.5 mg via INTRAVENOUS
  Filled 2020-11-09: qty 1

## 2020-11-09 MED ORDER — SIMETHICONE 80 MG PO CHEW
80.0000 mg | CHEWABLE_TABLET | Freq: Three times a day (TID) | ORAL | Status: DC
  Administered 2020-11-10 – 2020-11-11 (×5): 80 mg via ORAL
  Filled 2020-11-09 (×5): qty 1

## 2020-11-09 MED ORDER — PRENATAL MULTIVITAMIN CH
1.0000 | ORAL_TABLET | Freq: Every day | ORAL | Status: DC
  Administered 2020-11-10 – 2020-11-11 (×2): 1 via ORAL
  Filled 2020-11-09 (×2): qty 1

## 2020-11-09 MED ORDER — MEPERIDINE HCL 25 MG/ML IJ SOLN: 6.2500 mg | INTRAMUSCULAR | Status: DC | PRN

## 2020-11-09 MED ORDER — ACETAMINOPHEN 325 MG PO TABS: 650.0000 mg | ORAL_TABLET | ORAL | Status: DC | PRN

## 2020-11-09 MED ORDER — WITCH HAZEL-GLYCERIN EX PADS: 1.0000 "application " | MEDICATED_PAD | CUTANEOUS | Status: DC | PRN

## 2020-11-09 MED ORDER — POVIDONE-IODINE 10 % EX SWAB: 2.0000 "application " | Freq: Once | CUTANEOUS | Status: DC

## 2020-11-09 MED ORDER — BUPIVACAINE IN DEXTROSE 0.75-8.25 % IT SOLN
INTRATHECAL | Status: DC | PRN
  Administered 2020-11-09: 1.5 mL via INTRATHECAL

## 2020-11-09 MED ORDER — TRANEXAMIC ACID-NACL 1000-0.7 MG/100ML-% IV SOLN
1000.0000 mg | Freq: Once | INTRAVENOUS | Status: AC
  Administered 2020-11-09: 1000 mg via INTRAVENOUS

## 2020-11-09 MED ORDER — ENOXAPARIN SODIUM 40 MG/0.4ML IJ SOSY
40.0000 mg | PREFILLED_SYRINGE | INTRAMUSCULAR | Status: DC
  Administered 2020-11-10 – 2020-11-11 (×2): 40 mg via SUBCUTANEOUS
  Filled 2020-11-09 (×2): qty 0.4

## 2020-11-09 SURGICAL SUPPLY — 33 items
BENZOIN TINCTURE PRP APPL 2/3 (GAUZE/BANDAGES/DRESSINGS) ×2 IMPLANT
CHLORAPREP W/TINT 26ML (MISCELLANEOUS) ×2 IMPLANT
CLAMP CORD UMBIL (MISCELLANEOUS) IMPLANT
CLOSURE STERI STRIP 1/2 X4 (GAUZE/BANDAGES/DRESSINGS) ×2 IMPLANT
CLOTH BEACON ORANGE TIMEOUT ST (SAFETY) ×2 IMPLANT
DRSG OPSITE POSTOP 4X10 (GAUZE/BANDAGES/DRESSINGS) ×2 IMPLANT
ELECT REM PT RETURN 9FT ADLT (ELECTROSURGICAL) ×2
ELECTRODE REM PT RTRN 9FT ADLT (ELECTROSURGICAL) ×1 IMPLANT
EXTRACTOR VACUUM M CUP 4 TUBE (SUCTIONS) IMPLANT
GLOVE BIOGEL PI IND STRL 7.0 (GLOVE) ×2 IMPLANT
GLOVE BIOGEL PI IND STRL 7.5 (GLOVE) ×2 IMPLANT
GLOVE BIOGEL PI INDICATOR 7.0 (GLOVE) ×2
GLOVE BIOGEL PI INDICATOR 7.5 (GLOVE) ×2
GLOVE ECLIPSE 7.5 STRL STRAW (GLOVE) ×2 IMPLANT
GOWN STRL REUS W/TWL LRG LVL3 (GOWN DISPOSABLE) ×6 IMPLANT
KIT ABG SYR 3ML LUER SLIP (SYRINGE) IMPLANT
NEEDLE HYPO 25X5/8 SAFETYGLIDE (NEEDLE) IMPLANT
NS IRRIG 1000ML POUR BTL (IV SOLUTION) ×2 IMPLANT
PACK C SECTION WH (CUSTOM PROCEDURE TRAY) ×2 IMPLANT
PAD OB MATERNITY 4.3X12.25 (PERSONAL CARE ITEMS) ×2 IMPLANT
PENCIL SMOKE EVAC W/HOLSTER (ELECTROSURGICAL) ×2 IMPLANT
RTRCTR C-SECT PINK 25CM LRG (MISCELLANEOUS) ×2 IMPLANT
STRIP CLOSURE SKIN 1/2X4 (GAUZE/BANDAGES/DRESSINGS) ×2 IMPLANT
SUT PLAIN 2 0 XLH (SUTURE) ×2 IMPLANT
SUT VIC AB 0 CT1 36 (SUTURE) ×2 IMPLANT
SUT VIC AB 0 CTX 36 (SUTURE) ×2
SUT VIC AB 0 CTX36XBRD ANBCTRL (SUTURE) ×2 IMPLANT
SUT VIC AB 2-0 CT1 27 (SUTURE) ×1
SUT VIC AB 2-0 CT1 TAPERPNT 27 (SUTURE) ×1 IMPLANT
SUT VIC AB 4-0 KS 27 (SUTURE) ×2 IMPLANT
TOWEL OR 17X24 6PK STRL BLUE (TOWEL DISPOSABLE) ×2 IMPLANT
TRAY FOLEY W/BAG SLVR 14FR LF (SET/KITS/TRAYS/PACK) ×2 IMPLANT
WATER STERILE IRR 1000ML POUR (IV SOLUTION) ×2 IMPLANT

## 2020-11-09 NOTE — Anesthesia Postprocedure Evaluation (Signed)
Anesthesia Post Note  Patient: Maureen Johnston  Procedure(s) Performed: CESAREAN SECTION     Patient location during evaluation: PACU Anesthesia Type: Spinal Level of consciousness: oriented and awake and alert Pain management: pain level controlled Vital Signs Assessment: post-procedure vital signs reviewed and stable Respiratory status: spontaneous breathing, respiratory function stable and nonlabored ventilation Cardiovascular status: blood pressure returned to baseline and stable Postop Assessment: no headache, no backache, no apparent nausea or vomiting, spinal receding and patient able to bend at knees Anesthetic complications: no   No notable events documented.  Last Vitals:  Vitals:   11/09/20 1215 11/09/20 1230  BP: 113/69 115/72  Pulse: 67 67  Resp: 19 20  Temp: 36.8 C   SpO2: 91% 94%    Last Pain:  Vitals:   11/09/20 1230  TempSrc:   PainSc: 0-No pain   Pain Goal: Patients Stated Pain Goal: 2 (11/08/20 1833)  LLE Motor Response: Purposeful movement (11/09/20 1230)   RLE Motor Response: Purposeful movement (11/09/20 1230)       Epidural/Spinal Function Cutaneous sensation: No Sensation (11/09/20 1200), Patient able to flex knees: No (11/09/20 1200), Patient able to lift hips off bed: No (11/09/20 1200), Back pain beyond tenderness at insertion site: No (11/09/20 1200), Progressively worsening motor and/or sensory loss: No (11/09/20 1200), Bowel and/or bladder incontinence post epidural: No (11/09/20 1200)  Tranice Laduke A.

## 2020-11-09 NOTE — Progress Notes (Signed)
Patient ID: Maureen Johnston, female   DOB: June 01, 1985, 35 y.o.   MRN: [id number] FACULTY PRACTICE ANTEPARTUM(COMPREHENSIVE) NOTE  Maureen Johnston is a 35 y.o. [id number] at [redacted]w[redacted]d by early ultrasound who is admitted for preeclampsia .   Fetal presentation is breech. Length of Stay:  1  Days  Subjective:  Patient reports the fetal movement as active. Patient reports uterine contraction  activity as none. Patient reports  vaginal bleeding as none. Patient describes fluid per vagina as None.  Vitals:  Blood pressure 125/71, pulse 77, temperature 98 F (36.7 C), temperature source Oral, resp. rate 18, height 4\' 10"  (1.473 m), weight 79.4 kg, last menstrual period 04/16/2020, SpO2 94 %. Physical Examination:  General appearance - alert, well appearing, and in no distress Heart - normal rate and regular rhythm Abdomen - soft, nontender, nondistended Fundal Height:  size equals dates Cervical Exam: Not evaluated. . Extremities: extremities normal, atraumatic, no cyanosis or edema and Homans sign is negative, no sign of DVT Membranes:intact  Fetal Monitoring:   Fetal Heart Rate A   Mode External filed at 11/09/2020 0820  Baseline Rate (A) 115 bpm filed at 11/09/2020 0820  Variability Absent filed at 11/09/2020 0820  Accelerations None filed at 11/09/2020 0820  Decelerations Variable filed at 11/09/2020 0820    Labs:  Results for orders placed or performed during the hospital encounter of 11/08/20 (from the past 24 hour(s))  Comprehensive metabolic panel   Collection Time: 11/08/20  7:37 PM  Result Value Ref Range   Sodium 132 (L) 135 - 145 mmol/L   Potassium 4.1 3.5 - 5.1 mmol/L   Chloride 105 98 - 111 mmol/L   CO2 19 (L) 22 - 32 mmol/L   Glucose, Bld 115 (H) 70 - 99 mg/dL   BUN 12 6 - 20 mg/dL   Creatinine, Ser 01/09/21 0.44 - 1.00 mg/dL   Calcium 8.1 (L) 8.9 - 10.3 mg/dL   Total Protein 6.8 6.5 - 8.1 g/dL   Albumin 2.7 (L) 3.5 - 5.0 g/dL   AST 3.32 (H) 15 - 41 U/L   ALT 580 (H)  0 - 44 U/L   Alkaline Phosphatase 142 (H) 38 - 126 U/L   Total Bilirubin 0.5 0.3 - 1.2 mg/dL   GFR, Estimated 951 >88 mL/min   Anion gap 8 5 - 15  CBC   Collection Time: 11/08/20  7:37 PM  Result Value Ref Range   WBC 8.2 4.0 - 10.5 K/uL   RBC 4.69 3.87 - 5.11 MIL/uL   Hemoglobin 14.3 12.0 - 15.0 g/dL   HCT 01/09/21 66.0 - 63.0 %   MCV 89.8 80.0 - 100.0 fL   MCH 30.5 26.0 - 34.0 pg   MCHC 34.0 30.0 - 36.0 g/dL   RDW 16.0 10.9 - 32.3 %   Platelets 197 150 - 400 K/uL   nRBC 0.4 (H) 0.0 - 0.2 %  Type and screen [facility]   Collection Time: 11/08/20  7:37 PM  Result Value Ref Range   ABO/RH(D) O POS    Antibody Screen NEG    Sample Expiration      11/11/2020,2359 Performed at [facility], [address] [place], [place]   Culture, beta strep (group b only)   Collection Time: 11/08/20 10:00 PM   Specimen: Vaginal/Rectal; Genital  Result Value Ref Range   Specimen Description VAGINAL/RECTAL    Special Requests NONE    Culture      CULTURE REINCUBATED  FOR BETTER GROWTH Performed at [facility], [address] [place], [place] [address]    Report Status PENDING   CBC   Collection Time: 11/09/20  3:20 AM  Result Value Ref Range   WBC 9.6 4.0 - 10.5 K/uL   RBC 4.49 3.87 - 5.11 MIL/uL   Hemoglobin 13.5 12.0 - 15.0 g/dL   HCT 01.7 49.4 - 49.6 %   MCV 90.6 80.0 - 100.0 fL   MCH 30.1 26.0 - 34.0 pg   MCHC 33.2 30.0 - 36.0 g/dL   RDW 75.9 (H) 16.3 - 84.6 %   Platelets 201 150 - 400 K/uL   nRBC 0.5 (H) 0.0 - 0.2 %  Comprehensive metabolic panel   Collection Time: 11/09/20  3:20 AM  Result Value Ref Range   Sodium 131 (L) 135 - 145 mmol/L   Potassium 4.4 3.5 - 5.1 mmol/L   Chloride 103 98 - 111 mmol/L   CO2 19 (L) 22 - 32 mmol/L   Glucose, Bld 125 (H) 70 - 99 mg/dL   BUN 14 6 - 20 mg/dL   Creatinine, Ser 6.59 0.44 - 1.00 mg/dL   Calcium 7.1 (L) 8.9 - 10.3 mg/dL   Total Protein 6.3 (L) 6.5 - 8.1 g/dL   Albumin 2.5 (L) 3.5 - 5.0  g/dL   AST 935 (H) 15 - 41 U/L   ALT 604 (H) 0 - 44 U/L   Alkaline Phosphatase 109 38 - 126 U/L   Total Bilirubin 0.7 0.3 - 1.2 mg/dL   GFR, Estimated >70 >17 mL/min   Anion gap 9 5 - 15  Magnesium   Collection Time: 11/09/20  7:33 AM  Result Value Ref Range   Magnesium 7.7 (HH) 1.7 - 2.4 mg/dL  Results for orders placed or performed during the hospital encounter of 11/08/20 (from the past 24 hour(s))  Resp Panel by RT-PCR (Flu A&B, Covid) Nasopharyngeal Swab   Collection Time: 11/08/20  2:17 PM   Specimen: Nasopharyngeal Swab; Nasopharyngeal(NP) swabs in vial transport medium  Result Value Ref Range   SARS Coronavirus 2 by RT PCR NEGATIVE NEGATIVE   Influenza A by PCR NEGATIVE NEGATIVE   Influenza B by PCR NEGATIVE NEGATIVE  Protein / creatinine ratio, urine   Collection Time: 11/08/20  2:17 PM  Result Value Ref Range   Creatinine, Urine 30 mg/dL   Total Protein, Urine 11 mg/dL   Protein Creatinine Ratio 0.37 (H) 0.00 - 0.15 mg/mg[Cre]  Comprehensive metabolic panel   Collection Time: 11/08/20  2:17 PM  Result Value Ref Range   Sodium 134 (L) 135 - 145 mmol/L   Potassium 3.8 3.5 - 5.1 mmol/L   Chloride 106 98 - 111 mmol/L   CO2 22 22 - 32 mmol/L   Glucose, Bld 120 (H) 70 - 99 mg/dL   BUN 15 6 - 20 mg/dL   Creatinine, Ser 7.93 0.44 - 1.00 mg/dL   Calcium 8.7 (L) 8.9 - 10.3 mg/dL   Total Protein 6.6 6.5 - 8.1 g/dL   Albumin 2.9 (L) 3.5 - 5.0 g/dL   AST 903 (H) 15 - 41 U/L   ALT 496 (H) 0 - 44 U/L   Alkaline Phosphatase 125 38 - 126 U/L   Total Bilirubin 0.7 0.3 - 1.2 mg/dL   GFR, Estimated >00 >92 mL/min   Anion gap 6 5 - 15  CBC   Collection Time: 11/08/20  2:17 PM  Result Value Ref Range   WBC 7.7 4.0 - 10.5  K/uL   RBC 4.36 3.87 - 5.11 MIL/uL   Hemoglobin 13.6 12.0 - 15.0 g/dL   HCT 73.4 19.3 - 79.0 %   MCV 90.8 80.0 - 100.0 fL   MCH 31.2 26.0 - 34.0 pg   MCHC 34.3 30.0 - 36.0 g/dL   RDW 24.0 97.3 - 53.2 %   Platelets 185 150 - 400 K/uL   nRBC 0.3 (H) 0.0 -  0.2 %  Sample to Blood Bank   Collection Time: 11/08/20  2:17 PM  Result Value Ref Range   Blood Bank Specimen SAMPLE AVAILABLE FOR TESTING    Sample Expiration      11/11/2020,2359 Performed at [facility], [address] [place], [place] [address]   Results for orders placed or performed in visit on 11/08/20 (from the past 24 hour(s))  Urinalysis (Urine Dip)   Collection Time: 11/08/20 10:44 AM  Result Value Ref Range   Specific Gravity, UA 1.015 1.005 - 1.030   pH, UA 5.5 5.0 - 7.5   Color, UA Yellow Yellow   Appearance Ur Clear Clear   Leukocytes,UA 1+ (A) Negative   Protein,UA 1+ (A) Negative/Trace   Glucose, UA Negative Negative   Ketones, UA Negative Negative   RBC, UA Negative Negative   Bilirubin, UA Negative Negative   Urobilinogen, Ur 0.2 0.2 - 1.0 mg/dL   Nitrite, UA Negative Negative     Medications:  Scheduled  docusate sodium  100 mg Oral Daily   prenatal multivitamin  1 tablet Oral Q1200   I have reviewed the patient's current medications.  ASSESSMENT: Patient Active Problem List   Diagnosis Date Noted   Severe preeclampsia 11/09/2020   Elevated blood pressure affecting pregnancy in second trimester, antepartum 11/08/2020   Preeclampsia, severe 11/08/2020   Elevated blood pressure affecting pregnancy, antepartum 11/06/2020   Supervision of high risk pregnancy, antepartum 07/15/2020   Maternal obesity, antepartum BMI=36.6 07/15/2020   Advanced maternal age, primigravida, antepartum 35 yo 07/15/2020    PLAN: Ultrasound showed breech presentation, subjective low AFI and persistent reverse cord doppler flow. Consulted Dr. Judeth Cornfield and he agrees to proceed with CS delivery after second dose of BMZ. She was counseled with interpreter present. The risks of surgery were discussed with the patient including but were not limited to: bleeding which may require transfusion or reoperation; infection which may require antibiotics; injury to bowel, bladder,  ureters or other surrounding organs; injury to the fetus; need for additional procedures including hysterectomy in the event of a life-threatening hemorrhage; formation of adhesions; placental abnormalities with subsequent pregnancies; incisional problems; thromboembolic phenomenon and other postoperative/anesthesia complications.  The patient concurred with the proposed plan, giving informed written consent for the procedure.   Patient has been NPO since MN she will remain NPO for procedure. Anesthesia and OR aware. Preoperative prophylactic antibiotics and SCDs ordered on call to the OR.  To OR when ready.   Scheryl Darter 11/09/2020,9:36 AM

## 2020-11-09 NOTE — Op Note (Signed)
Maureen Johnston PROCEDURE DATE: 11/09/2020  PREOPERATIVE DIAGNOSES: Intrauterine pregnancy at [redacted]w[redacted]d weeks gestation; malpresentation: breech presentation, severe preeclampsia, and elevated dopplers  POSTOPERATIVE DIAGNOSES: The same  PROCEDURE: Primary Low Transverse Cesarean Section  SURGEON:  Dr. Shonna Chock  ASSISTANT: Dr. Mart Piggs  ANESTHESIOLOGY TEAM: Anesthesiologist: Mal Amabile, MD CRNA: Rhymer, Doree Fudge, CRNA  INDICATIONS: Maureen Johnston is a 35 y.o. [phone] at [redacted]w[redacted]d here for cesarean section secondary to the indications listed under preoperative diagnoses; please see preoperative note for further details.  The risks of cesarean section were discussed with the patient including but were not limited to: bleeding which may require transfusion or reoperation; infection which may require antibiotics; injury to bowel, bladder, ureters or other surrounding organs; injury to the fetus; need for additional procedures including hysterectomy in the event of a life-threatening hemorrhage; placental abnormalities wth subsequent pregnancies, incisional problems, thromboembolic phenomenon and other postoperative/anesthesia complications.   The patient concurred with the proposed plan, giving informed written consent for the procedure.    FINDINGS:  Viable female infant in cephalic presentation.  Apgars 2, 4, and 7.  Clear amniotic fluid.  Intact placenta, three vessel cord with placental abruption noted at delivery.  Normal uterus, fallopian tubes and ovaries bilaterally.  ANESTHESIA: Spinal  INTRAVENOUS FLUIDS: 400 ml   ESTIMATED BLOOD LOSS: 163 ml URINE OUTPUT:  200 ml clear urine SPECIMENS: Placenta sent to L&D per Dr. Ashok Pall COMPLICATIONS: None immediate  PROCEDURE IN DETAIL:  The patient preoperatively received intravenous antibiotics and had sequential compression devices applied to her lower extremities.  She was then taken to the operating room where spinal anesthesia was  administered and was found to be adequate. She was then placed in a dorsal supine position with a leftward tilt, and prepped and draped in a sterile manner.  A foley catheter was placed into her bladder and attached to constant gravity.  After an adequate timeout was performed, a Pfannenstiel skin incision was made with scalpel and carried through to the underlying layer of fascia. The fascia was incised in the midline, and this incision was extended bilaterally using the Mayo scissors.  Kocher clamps were applied to the superior aspect of the fascial incision and the underlying rectus muscles were dissected off bluntly and sharply.  The rectus muscles were separated in the midline and the peritoneum was entered bluntly. The Alexis self-retaining retractor was introduced into the abdominal cavity.  Attention was turned to the lower uterine segment where a low transverse hysterotomy was made with a scalpel and extended bilaterally bluntly.  The infant was successfully delivered, the cord was clamped and cut immediately, and the infant was handed over to the awaiting neonatology team. Arterial blood gas sent. Uterine massage was then administered, and the placenta delivered intact with a three-vessel cord, placental abruption noted at delivery of placenta. The uterus was then cleared of clots and debris.  The hysterotomy was closed with 0 Vicryl in a running locked fashion, and an imbricating layer was also placed with 0 Vicryl.  The pelvis was cleared of all clot and debris. Hemostasis was confirmed on all surfaces.  The retractor was removed.  The fascia was then closed using 0 Vicryl in a running fashion.  The subcutaneous layer was irrigated, reapproximated with 2-0 plain gut running stitches, and the skin was closed with a 4-0 Vicryl subcuticular stitch. A wound vac was placed over the incision by the surgical team. The patient tolerated the procedure well. Sponge, instrument and needle counts were  correct x 3.   She was taken to the recovery room in stable condition.   Alric Seton, MD OB Fellow, Faculty [facility], Center for [facility] 11/09/2020 11:10 AM

## 2020-11-09 NOTE — Anesthesia Procedure Notes (Signed)
Spinal  Patient location during procedure: OR Start time: 11/09/2020 10:06 AM End time: 11/09/2020 10:09 AM Reason for block: surgical anesthesia Staffing Performed: anesthesiologist  Anesthesiologist: Mal Amabile, MD Preanesthetic Checklist Completed: patient identified, IV checked, site marked, risks and benefits discussed, surgical consent, monitors and equipment checked, pre-op evaluation and timeout performed Spinal Block Patient position: sitting Prep: DuraPrep and site prepped and draped Patient monitoring: heart rate, cardiac monitor, continuous pulse ox and blood pressure Approach: midline Location: L3-4 Injection technique: single-shot Needle Needle type: Pencan  Needle gauge: 24 G Needle length: 9 cm Needle insertion depth: 6 cm Assessment Sensory level: T4 Events: CSF return Additional Notes Patient tolerated procedure well. Adequate sensory level.

## 2020-11-09 NOTE — Discharge Summary (Signed)
Postpartum Discharge Summary  Date of Service updated 11/11/2020      Patient Name: Maureen Johnston DOB: [birth year] MRN: [id number]  Date of admission: 11/08/2020 Delivery date:11/09/2020  Delivering provider: Laurey Arrow [place]  Date of discharge: 11/11/2020   Admitting diagnosis: Severe preeclampsia [O14.10] Intrauterine pregnancy: [redacted]w[redacted]d     Secondary diagnosis:  Active Problems:   Supervision of high risk pregnancy, antepartum   Maternal obesity, antepartum BMI=36.6   Advanced maternal age, primigravida, antepartum 35 yo   Elevated blood pressure affecting pregnancy in second trimester, antepartum   Preeclampsia, severe   Severe preeclampsia   Transaminitis   Language barrier  Additional problems: none    Discharge diagnosis: Preterm Pregnancy Delivered and Preeclampsia (severe)                                              Post partum procedures: magnesium infusion Augmentation: N/A Complications: Placental Abruption  Hospital course: Sceduled C/S   35 y.o. yo [id number] at [redacted]w[redacted]d was admitted to the hospital 11/08/2020 for unscheduled, urgent cesarean section with the following indication: severe preE/HELLP . Patient initially presented to ED at [facility] with headaches and vision changes and was found to have severe range BP and transaminitis concerning for HELLP. She was given 1 dose BMZ and transferred to antepartum at [facility]. Patient found to have reverse EDF 7/13, breech presentation and worsening transaminitis so decision made to proceed with cesarean section at that time. Delivery details are as follows:  Membrane Rupture Time/Date:  ,   Delivery Method:C-Section, Low Transverse  Details of operation can be found in separate operative note.  Patient had an uncomplicated postpartum course.Her LFT improved steadily. BP was controlled. She is ambulating, tolerating a regular diet, passing flatus, and urinating well. Patient is discharged home in stable condition on   11/11/2020         Newborn Data: Birth date:11/09/2020  Birth time:10:25 AM  Gender:Female  Living status:Living  Apgars:2 ,4  Weight:710 g     Magnesium Sulfate received: Yes: Neuroprotection and seizure prophylaxis BMZ received: Yes x1 dose Rhophylac:N/A MMR:N/A T-DaP:Given prenatally Flu: No Transfusion:No  Physical exam  Vitals:   11/10/20 1900 11/10/20 2323 11/11/20 0355 11/11/20 0812  BP: 133/72 132/76 127/75 132/77  Pulse: 60 69 72 60  Resp: $Remo'16 19 18 16  'jkEdW$ Temp: 98.6 F (37 C) 98.2 F (36.8 C) 99 F (37.2 C) 98.5 F (36.9 C)  TempSrc: Oral Oral Oral Oral  SpO2: 100% 98% 99% 97%  Weight:      Height:       General: alert, cooperative, and no distress Lochia: appropriate Uterine Fundus: firm Incision: Prevena in place DVT Evaluation: No evidence of DVT seen on physical exam. Labs: Lab Results  Component Value Date   WBC 13.0 (H) 11/10/2020   HGB 11.3 (L) 11/10/2020   HCT 35.0 (L) 11/10/2020   MCV 92.8 11/10/2020   PLT 207 11/10/2020   CMP Latest Ref Rng & Units 11/11/2020  Glucose 70 - 99 mg/dL 87  BUN 6 - 20 mg/dL 16  Creatinine 0.44 - 1.00 mg/dL 0.77  Sodium 135 - 145 mmol/L 136  Potassium 3.5 - 5.1 mmol/L 4.6  Chloride 98 - 111 mmol/L 107  CO2 22 - 32 mmol/L 24  Calcium 8.9 - 10.3 mg/dL 6.7(L)  Total Protein 6.5 - 8.1 g/dL 5.5(L)  Total Bilirubin 0.3 - 1.2 mg/dL 0.6  Alkaline Phos 38 - 126 U/L 93  AST 15 - 41 U/L 98(H)  ALT 0 - 44 U/L 239(H)   CMP Latest Ref Rng & Units 11/11/2020 11/10/2020 11/09/2020  Glucose 70 - 99 mg/dL 87 133(H) 127(H)  BUN 6 - 20 mg/dL $Remove'16 20 18  'CtmVRTm$ Creatinine 0.44 - 1.00 mg/dL 0.77 0.95 0.83  Sodium 135 - 145 mmol/L 136 129(L) 129(L)  Potassium 3.5 - 5.1 mmol/L 4.6 4.7 4.8  Chloride 98 - 111 mmol/L 107 102 103  CO2 22 - 32 mmol/L 24 22 19(L)  Calcium 8.9 - 10.3 mg/dL 6.7(L) 5.9(LL) 5.9(LL)  Total Protein 6.5 - 8.1 g/dL 5.5(L) 5.7(L) 5.9(L)  Total Bilirubin 0.3 - 1.2 mg/dL 0.6 0.1(L) 0.3  Alkaline Phos 38 - 126 U/L 93 95 106   AST 15 - 41 U/L 98(H) 290(H) 418(H)  ALT 0 - 44 U/L 239(H) 452(H) 550(H)    Flavia Shipper Score: No flowsheet data found.   After visit meds:  Allergies as of 11/11/2020   No Known Allergies      Medication List     STOP taking these medications    aspirin EC 81 MG tablet       TAKE these medications    amLODipine 5 MG tablet Commonly known as: NORVASC Take 1 tablet (5 mg total) by mouth daily. Start taking on: November 12, 2020   ibuprofen 600 MG tablet Commonly known as: ADVIL Take 1 tablet (600 mg total) by mouth every 6 (six) hours.   oxyCODONE 5 MG immediate release tablet Commonly known as: Oxy IR/ROXICODONE Take 1-2 tablets (5-10 mg total) by mouth every 4 (four) hours as needed for moderate pain.   prenatal multivitamin Tabs tablet Take 1 tablet by mouth daily at 12 noon.         Discharge home in stable condition Infant Feeding: Bottle and Breast Infant Disposition:NICU Discharge instruction: per After Visit Summary and Postpartum booklet. Activity: Advance as tolerated. Pelvic rest for 6 weeks.  Diet: routine diet Future Appointments: Future Appointments  Date Time Provider [place]  12/20/2020 11:00 AM [facility]   Follow up Visit:  Follow-up Information     [facility] Follow up on 11/15/2020.   Why: For wound re-check. BP. Prevena removal Contact information: [place]               Message sent to [facility] and Dr. Ernestina Patches was aware for 1 week BP and incision check, prevena wound vac placed. Instruct patient to follow up at [facility] for 6 week postpartum visit and IUD placement.    Please schedule this patient for a In person postpartum visit in 6 weeks with the following provider: Any provider. Additional Postpartum F/U:Incision check 1 week and BP check 1 week  High risk pregnancy complicated by: HTN Delivery mode:  C-Section,  Low Transverse  Anticipated Birth Control:  IUD $Re'@[facility]'zgP$    11/11/2020 Emeterio Reeve, MD

## 2020-11-09 NOTE — Transfer of Care (Signed)
Immediate Anesthesia Transfer of Care Note  Patient: Maureen Johnston  Procedure(s) Performed: CESAREAN SECTION  Patient Location: PACU  Anesthesia Type:Spinal  Level of Consciousness: awake, alert  and oriented  Airway & Oxygen Therapy: Patient Spontanous Breathing  Post-op Assessment: Report given to RN and Post -op Vital signs reviewed and stable  Post vital signs: Reviewed and stable  Last Vitals:  Vitals Value Taken Time  BP 113/69 11/09/20 1215  Temp 36.8 C 11/09/20 1115  Pulse 68 11/09/20 1222  Resp 18 11/09/20 1222  SpO2 96 % 11/09/20 1222  Vitals shown include unvalidated device data.  Last Pain:  Vitals:   11/09/20 1200  TempSrc:   PainSc: 0-No pain      Patients Stated Pain Goal: 2 (11/08/20 1833)  Complications: No notable events documented.

## 2020-11-09 NOTE — Anesthesia Preprocedure Evaluation (Signed)
Anesthesia Evaluation  Patient identified by MRN, date of birth, ID band Patient awake    Reviewed: Allergy & Precautions, NPO status , Patient's Chart, lab work & pertinent test results, reviewed documented beta blocker date and time   Airway Mallampati: III  TM Distance: >3 FB Neck ROM: Full    Dental no notable dental hx. (+) Teeth Intact, Dental Advisory Given   Pulmonary neg pulmonary ROS,    Pulmonary exam normal breath sounds clear to auscultation       Cardiovascular hypertension, Pt. on medications Normal cardiovascular exam Rhythm:Regular Rate:Normal     Neuro/Psych negative neurological ROS  negative psych ROS   GI/Hepatic negative GI ROS, Elevated LFT's   Endo/Other  Obesity  Renal/GU negative Renal ROS  negative genitourinary   Musculoskeletal negative musculoskeletal ROS (+)   Abdominal (+) + obese,   Peds  Hematology negative hematology ROS (+)   Anesthesia Other Findings   Reproductive/Obstetrics 26 weeks  Severe pre eclampsia- on MgSO4 worsening LFT's                             Anesthesia Physical Anesthesia Plan  ASA: 3  Anesthesia Plan: Spinal   Post-op Pain Management:    Induction:   PONV Risk Score and Plan: 4 or greater and Treatment may vary due to age or medical condition and Scopolamine patch - Pre-op  Airway Management Planned: Natural Airway  Additional Equipment:   Intra-op Plan:   Post-operative Plan:   Informed Consent: I have reviewed the patients History and Physical, chart, labs and discussed the procedure including the risks, benefits and alternatives for the proposed anesthesia with the patient or authorized representative who has indicated his/her understanding and acceptance.     Dental advisory given  Plan Discussed with: CRNA and Anesthesiologist  Anesthesia Plan Comments:         Anesthesia Quick Evaluation

## 2020-11-09 NOTE — Progress Notes (Signed)
Dr. Charlotta Newton notified of critical Magnesium level of 8.3 and Calcium level of 5.9.  Orders received.

## 2020-11-09 NOTE — Progress Notes (Signed)
Assisted Dr Debroah Loop with explanation about a C section risk and benefits from the procedure, by Orlan Leavens [employer]pre[employer].

## 2020-11-09 NOTE — Progress Notes (Signed)
Pt is s/p c/s this morning for severe preeclampsia. Called to post-op by rn for passing 2 small clots. Ebl ~250 ml. On exam fundus firm. Uterine sweep attempted but cervix 1.5 cm dilated, unable to sweep full uterine cavity. No further bleeding at the moment. Will order 2nd dose of txa and monitor closely.

## 2020-11-09 NOTE — Lactation Note (Signed)
This note was copied from a baby's chart. Lactation Consultation Note  Patient Name: Maureen Johnston BDZHG'D Date: 11/09/2020 Reason for consult: Initial assessment;NICU baby;Preterm <34wks;Infant < 6lbs Age:37 hours  Visited with mom of 3 hours old pre-term female, she's a P4 and reported (++) breast changes during the pregnancy.  LC assisted mom with breast massage and hand expression, when offered to set up a DEBP, mom politely declined, she said she wasn't feeling well and she prefers to try later once she's no longer under the effects of the anesthesia.  She's able to get small droplets of colostrum from both breasts, praised her for her efforts. Reviewed pumping schedule, breastmilk storage guidelines and benefits of premature milk for NICU babies.  Plan of care:  Encouraged mom to let her RN know when she's ready to start pumping She'll try pumping every 2-3 hours, ideally at least 8 times/24 hours  BF brochure (SP), BF resources (SP) and feeding diary (SP) were reviewed. FOB present. Parents reported all questions and concerns were answered, they're both aware of LC OP services and will call PRN.  Maternal Data Has patient been taught Hand Expression?: Yes Does the patient have breastfeeding experience prior to this delivery?: Yes How long did the patient breastfeed?: # 1 and # 2 for one year and last baby for 6 months  Feeding Mother's Current Feeding Choice: Breast Milk and Donor Milk  LATCH Score                    Lactation Tools Discussed/Used    Interventions Interventions: Breast feeding basics reviewed;Education;Hand express;Breast massage  Discharge WIC Program: Yes  Consult Status Consult Status: Follow-up Date: 11/10/20 Follow-up type: In-patient    Maureen Johnston 11/09/2020, 1:29 PM

## 2020-11-10 ENCOUNTER — Encounter (HOSPITAL_COMMUNITY): Payer: Self-pay | Admitting: Obstetrics and Gynecology

## 2020-11-10 ENCOUNTER — Ambulatory Visit: Payer: Self-pay

## 2020-11-10 DIAGNOSIS — Z789 Other specified health status: Secondary | ICD-10-CM | POA: Diagnosis present

## 2020-11-10 LAB — CBC
HCT: 35 % — ABNORMAL LOW (ref 36.0–46.0)
Hemoglobin: 11.3 g/dL — ABNORMAL LOW (ref 12.0–15.0)
MCH: 30 pg (ref 26.0–34.0)
MCHC: 32.3 g/dL (ref 30.0–36.0)
MCV: 92.8 fL (ref 80.0–100.0)
Platelets: 207 10*3/uL (ref 150–400)
RBC: 3.77 MIL/uL — ABNORMAL LOW (ref 3.87–5.11)
RDW: 15.9 % — ABNORMAL HIGH (ref 11.5–15.5)
WBC: 13 10*3/uL — ABNORMAL HIGH (ref 4.0–10.5)
nRBC: 0 % (ref 0.0–0.2)

## 2020-11-10 LAB — CULTURE, BETA STREP (GROUP B ONLY)

## 2020-11-10 LAB — COMPREHENSIVE METABOLIC PANEL
ALT: 452 U/L — ABNORMAL HIGH (ref 0–44)
AST: 290 U/L — ABNORMAL HIGH (ref 15–41)
Albumin: 2.3 g/dL — ABNORMAL LOW (ref 3.5–5.0)
Alkaline Phosphatase: 95 U/L (ref 38–126)
Anion gap: 5 (ref 5–15)
BUN: 20 mg/dL (ref 6–20)
CO2: 22 mmol/L (ref 22–32)
Calcium: 5.9 mg/dL — CL (ref 8.9–10.3)
Chloride: 102 mmol/L (ref 98–111)
Creatinine, Ser: 0.95 mg/dL (ref 0.44–1.00)
GFR, Estimated: >60 mL/min (ref >60)
Glucose, Bld: 133 mg/dL — ABNORMAL HIGH (ref 70–99)
Potassium: 4.7 mmol/L (ref 3.5–5.1)
Sodium: 129 mmol/L — ABNORMAL LOW (ref 135–145)
Total Bilirubin: 0.1 mg/dL — ABNORMAL LOW (ref 0.3–1.2)
Total Protein: 5.7 g/dL — ABNORMAL LOW (ref 6.5–8.1)

## 2020-11-10 LAB — MAGNESIUM
Magnesium: 8.3 mg/dL (ref 1.7–2.4)
Magnesium: 7.4 mg/dL (ref 1.7–2.4)

## 2020-11-10 LAB — SURGICAL PATHOLOGY

## 2020-11-10 MED ORDER — DOCUSATE SODIUM 100 MG PO CAPS: 100.0000 mg | ORAL_CAPSULE | Freq: Two times a day (BID) | ORAL | Status: DC | PRN

## 2020-11-10 MED ORDER — AMLODIPINE BESYLATE 5 MG PO TABS
5.0000 mg | ORAL_TABLET | Freq: Every day | ORAL | Status: DC
  Administered 2020-11-10 – 2020-11-11 (×2): 5 mg via ORAL
  Filled 2020-11-10 (×2): qty 1

## 2020-11-10 NOTE — Lactation Note (Signed)
This note was copied from a baby's chart. Lactation Consultation Note  Patient Name: Maureen Johnston SJGGE'Z Date: 11/10/2020 Reason for consult: Follow-up assessment;NICU baby;Preterm <34wks;Infant < 6lbs Age:37 hours  Visited with mom of 27 hours old preterm NICU female, she's a P4 and just started pumping today, mom voiced she was too sick and dizzy to do it yesterday.  Revised hand expression and breast massage again, LC assisted with washing pump parts, mom is still trying to get up on her own. Reviewed pumping expectations, lactogenesis II and benefits of premature milk for NICU babies.  Plan of care:   Encouraged mom to start pumping consistently every 2-3 hours, ideally at least 8 times/24 hours Pumping time starts from the moment she starts pumping (not when she ends) She'll add breast massage, hand expression and coconut oil prior pumping   No support person at this time. Mom reported all questions and concerns were answered, she's aware of LC OP services and will call PRN.   Maternal Data    Feeding Mother's Current Feeding Choice: Breast Milk and Donor Milk   Lactation Tools Discussed/Used Tools: Pump;Flanges;Coconut oil Flange Size: 24 Breast pump type: Double-Electric Breast Pump Pump Education: Setup, frequency, and cleaning;Milk Storage Reason for Pumping: preterm NICU infant Pumping frequency: only started pumping today, just once but advised to do at least 8 pumping sessions/24 hours Pumped volume: 2 mL  Interventions Interventions: Breast feeding basics reviewed;Breast massage;Hand express;Education;Coconut oil;DEBP  Discharge Pump: DEBP  Consult Status Consult Status: Follow-up Date: 11/11/20 Follow-up type: In-patient    Maureen Johnston 11/10/2020, 1:50 PM

## 2020-11-10 NOTE — Progress Notes (Signed)
Subjective: Postpartum Day 1: Cesarean Delivery Patient reports incisional pain.    Objective: Vital signs in last 24 hours: Temp:  [97.9 F (36.6 C)-98.6 F (37 C)] 98.6 F (37 C) (07/14 0722) Pulse Rate:  [59-72] 64 (07/14 0722) Resp:  [14-22] 16 (07/14 0910) BP: (95-135)/(61-86) 125/76 (07/14 0722) SpO2:  [86 %-98 %] 96 % (07/14 0723)  Physical Exam:  General: alert, cooperative, and no distress Lochia: appropriate Uterine Fundus: firm Incision: wound vac in place DVT Evaluation: No evidence of DVT seen on physical exam.  Recent Labs    11/09/20 1629 11/10/20 0111  HGB 12.1 11.3*  HCT 37.4 35.0*   CMP Latest Ref Rng & Units 11/10/2020 11/09/2020 11/09/2020  Glucose 70 - 99 mg/dL 938(H) 829(H) 371(I)  BUN 6 - 20 mg/dL 20 18 14   Creatinine 0.44 - 1.00 mg/dL 9.67 8.93  Sodium 135 - 145 mmol/L 129(L) 129(L) 131(L)  Potassium 3.5 - 5.1 mmol/L 4.7 4.8 4.4  Chloride 98 - 111 mmol/L 102 103 103  CO2 22 - 32 mmol/L 22 19(L) 19(L)  Calcium 8.9 - 10.3 mg/dL 5.9(LL) 5.9(LL) 7.1(L)  Total Protein 6.5 - 8.1 g/dL 8.10) 5.9(L) 6.3(L)  Total Bilirubin 0.3 - 1.2 mg/dL 1.7(P) 0.3 0.7  Alkaline Phos 38 - 126 U/L 95 106 109  AST 15 - 41 U/L 290(H) 418(H) 504(H)  ALT 0 - 44 U/L 452(H) 550(H) 604(H)     Assessment/Plan: Status post Cesarean section.  D/C magnesium, ambulate   Continue current care.  1.0(C 11/10/2020, 10:34 AM

## 2020-11-11 ENCOUNTER — Telehealth: Payer: Self-pay

## 2020-11-11 LAB — COMPREHENSIVE METABOLIC PANEL
ALT: 239 U/L — ABNORMAL HIGH (ref 0–44)
AST: 98 U/L — ABNORMAL HIGH (ref 15–41)
Albumin: 2.4 g/dL — ABNORMAL LOW (ref 3.5–5.0)
Alkaline Phosphatase: 93 U/L (ref 38–126)
Anion gap: 5 (ref 5–15)
BUN: 16 mg/dL (ref 6–20)
CO2: 24 mmol/L (ref 22–32)
Calcium: 6.7 mg/dL — ABNORMAL LOW (ref 8.9–10.3)
Chloride: 107 mmol/L (ref 98–111)
Creatinine, Ser: 0.77 mg/dL (ref 0.44–1.00)
GFR, Estimated: >60 mL/min (ref >60)
Glucose, Bld: 87 mg/dL (ref 70–99)
Potassium: 4.6 mmol/L (ref 3.5–5.1)
Sodium: 136 mmol/L (ref 135–145)
Total Bilirubin: 0.6 mg/dL (ref 0.3–1.2)
Total Protein: 5.5 g/dL — ABNORMAL LOW (ref 6.5–8.1)

## 2020-11-11 MED ORDER — OXYCODONE HCL 5 MG PO TABS: 5.0000 mg | ORAL_TABLET | ORAL | 0 refills | Status: DC | PRN

## 2020-11-11 MED ORDER — AMLODIPINE BESYLATE 5 MG PO TABS: 5.0000 mg | ORAL_TABLET | Freq: Every day | ORAL | 1 refills | Status: DC

## 2020-11-11 MED ORDER — IBUPROFEN 600 MG PO TABS: 600.0000 mg | ORAL_TABLET | Freq: Four times a day (QID) | ORAL | 0 refills | Status: AC

## 2020-11-11 NOTE — Progress Notes (Signed)
Spoke to patient today with help of Video Interpreters "Duwayne Heck [phone]" And "Paula Compton [phone]" (one call dropped) to review plan of care for the day.  We reviewed medications, the wound vac and preparation for home, pain management, pumping breastmilk, and visiting the baby.

## 2020-11-11 NOTE — Clinical Social Work Maternal (Signed)
CLINICAL SOCIAL WORK MATERNAL/CHILD NOTE  Patient Details  Name: Boy Kathyann Spaugh MRN: [id number] Date of Birth: 11/09/2022  Date:  11/11/2020  Clinical Social Worker Initiating Note:  Laurey Arrow Date/Time: Initiated:  11/11/20/1340     Child's Name:  [facility]   Biological Parents:  Mother, Father   Need for Interpreter:  Spanish   Reason for Referral:  Parental Support of Premature Babies < 32 weeks/or Critically Ill babies   Address:  [address] [place] [address]    Phone number:  [phone] (home)     Additional phone number: FOB's number is [phone]  Household Members/Support Persons (HM/SP):   Household Member/Support Person 1   HM/SP Name Relationship DOB or Age  HM/SP -1 Exequiel Ponce FOB 08/08/1983  HM/SP -2        HM/SP -3        HM/SP -4        HM/SP -5        HM/SP -6        HM/SP -7        HM/SP -8          Natural Supports (not living in the home):  Immediate Family, Friends (Per MOB and FOB, FOB's family will also provide supports if needed.)   Professional Supports: None   Employment: Unemployed   Type of Work:     Education:  9 to 11 years   Homebound arranged: No  Museum/gallery curator Resources:  [employer]   Other Resources:  [employer] (MOB plans to apply for [employer].)   Cultural/Religious Considerations Which May Impact Care: None reported  Strengths:  Ability to meet basic needs  , Lexicographer chosen, Home prepared for child     Psychotropic Medications:         Pediatrician:    [employer]  Pediatrician List:   [place]      Pediatrician Fax Number:    Risk Factors/Current Problems:  None   Cognitive State:  Able to Concentrate  , Alert  , Insightful  , Linear Thinking     Mood/Affect:  Interested  , Comfortable  , Happy  , Bright  , Relaxed     CSW Assessment: CSW met with MOB in room  106 to complete an assessment for NICU admission. CSW initially used interpreting assistance Asencion Partridge [phone]) to assist with language barrier.  However, MOB approved for FOB to provide interpreting. CSW explained CSW's role and MOB gave CSW permission for CSW to complete clinical assessment while FOB was present. FOB appeared to be support for MOB  and actively participated in the assessment.  MOB was polite, easy to engage, and receptive to meeting with CSW.   MOB and FOB shared feeling well informed by the NICU medical team and denied having any questions or concerns. The couple denied barriers to visiting infant and reported having a good support team. CSW made the couple aware of transportation resources that are available if a need a rises. The couple also confirmed that they were understanding of NICU visitation.   CSW provided education regarding the baby blues period vs. perinatal mood disorders, discussed treatment and gave resources for mental health follow up if concerns arise.  CSW recommends self-evaluation during the postpartum time period and encouraged MOB to contact a medical professional if symptoms are noted at any time. MOB  denied PMAD symptoms with MOB older 3 children.   CSW discussed infant's eligibility for SSI benefits. The couple expressed interest in apply.  CSW reviewed application process and encouraged them to reach to CSW if any questions arise while applying.   CSW will continue to offer resources and supports to family while infant remains in NICU.    CSW Plan/Description:  Psychosocial Support and Ongoing Assessment of Needs, Perinatal Mood and Anxiety Disorder (PMADs) Education, Other Patient/Family Education, Theatre stage manager Income (SSI) Information, Other Information/Referral to [employer], MSW, [employer] Clinical Social Work [phone]

## 2020-11-11 NOTE — Progress Notes (Signed)
Discharge instructions reviewed with patient with help of interpreter "Gillis Santa [phone]" including home wound vacuum (applied), incision care after removal, follow-up appointments, breast pump review, and complications to look out for. Patient was tearful expressing sadness about leaving baby in the NICU.  Normalized patient's feelings and provided emotional support.    Patient discharged to home with husband.  Condition stable.  Patient ambulated to NICU with plans to leave hospital from there.

## 2020-11-11 NOTE — Telephone Encounter (Signed)
TC to patient who states she is home and baby is still at the hospital and is doing well. She states she was told at the hospital to make an appointment with her provider for Tuesday next week for BP check and incision check. Patient scheduled for 11/15/2020. [address], [place], [place] [address].Marland KitchenBurt Knack, RN

## 2020-11-11 NOTE — Discharge Summary (Signed)
Pt admitted to [facility] with preeclampsia with severe features and her care was transfer to [facility] due to her gestational age [redacted] weeks

## 2020-11-11 NOTE — Telephone Encounter (Signed)
TC to patient to schedule PP BP check on 11/18/2020, patient phone not accepting calls at this time. TC to patient emergency contact, and no VM set up, unable to LM. [address], [place], [place] [address].Marland KitchenBurt Knack, RN

## 2020-11-14 ENCOUNTER — Ambulatory Visit: Payer: Self-pay

## 2020-11-14 NOTE — Lactation Note (Signed)
This note was copied from a baby's chart. Lactation Consultation Note  Patient Name: Boy Teisha Trowbridge UXLKG'M Date: 11/14/2020 Reason for consult: Follow-up assessment;NICU baby;Preterm <34wks;Infant < 6lbs Age:37 days  Visited with mom of 45 38/41 weeks old NICU female, she's a P4 and reported she hasn't picked up her DEBP from the [facility] office yet, she will tomorrow, her appt is at 9 am.   She's been pumping consistently and her supply has rapidly increased, she's excited we're finally using a bit of her EBM for oral feedings. Reviewed how we could do that through oral care, and to check with NICU staff.  Plan of care:   Encouraged mom to start pumping consistently every 2-3 hours, at least 8 times/24 hours Pumping time starts from the moment she starts pumping (not when she ends) She'll pick up her DEBP from the [facility] office tomorrow   No support person at this time. Mom reported all questions and concerns were answered, she's aware of LC OP services and will call PRN.  Maternal Data   Mom's supply is WNL  Feeding Mother's Current Feeding Choice: Breast Milk and Donor Milk  Lactation Tools Discussed/Used Tools: Pump;Flanges;Coconut oil Flange Size: 24 Breast pump type: Double-Electric Breast Pump;Manual Pump Education: Setup, frequency, and cleaning Reason for Pumping: pre-term NICU infant Pumping frequency: q 3-4 hours (hand pump at home, DEBP when visiting baby) Pumped volume: 120 mL  Interventions Interventions: Breast feeding basics reviewed;DEBP;Education;Coconut oil  Discharge Pump: DEBP;Manual;Personal  Consult Status Consult Status: Follow-up Follow-up type: In-patient  Romonda Parker [name] 11/14/2020, 5:13 PM

## 2020-11-15 ENCOUNTER — Ambulatory Visit: Payer: Self-pay | Admitting: Advanced Practice Midwife

## 2020-11-15 ENCOUNTER — Other Ambulatory Visit: Payer: Self-pay

## 2020-11-15 DIAGNOSIS — O162 Unspecified maternal hypertension, second trimester: Secondary | ICD-10-CM

## 2020-11-15 DIAGNOSIS — O1410 Severe pre-eclampsia, unspecified trimester: Secondary | ICD-10-CM

## 2020-11-15 LAB — URINALYSIS
Bilirubin, UA: NEGATIVE
Glucose, UA: NEGATIVE
Ketones, UA: NEGATIVE
Nitrite, UA: NEGATIVE
Specific Gravity, UA: 1.025 (ref 1.005–1.030)
Urobilinogen, Ur: 0.2 mg/dL (ref 0.2–1.0)
pH, UA: 6 (ref 5.0–7.5)

## 2020-11-15 NOTE — Progress Notes (Addendum)
Here today for 6.0 days PP BP check and post op wound check.  Per Epic notes patient delivered viable female child 710 gms on 11/09/2020 at [facility] in [place]. PP vitals today are as follows: Wt. 168.0 lb, BP 123/86, P 82, Temp. 97.4. Patient has a post op wound vac in place. Continues taking PNV and prescribed PP BP medicine. Patient tearful and states "baby is doing well." UA today. Tawny Hopping, RN

## 2020-11-15 NOTE — Progress Notes (Signed)
35 yo G5P3013 seen for routine prenatal visit on 11/08/20 c/o h/a with scotoma since day before, decreased fetal movement since day before, BP 150/99 with 1+ proteinuria. Sent to [facility] L&D and dx'd with transaminitis concerning for HELLP, severe preeclampsia, BMZ x1 given and transferred to [facility]. BP 177/113 with worsening transaminitis so decision made to begin MgSo4 and proceed with LTCS. Breech presentation, abruption, 710 gm female infant with 2/4 Apgars on 11/09/20 at 26 6/7 wks.  Pt discharged home on 11/11/20 with Prevena drain and on Amlodipine 5 mg daily.  S: Pt states here for BP check and to have drain removed. C/o h/a since yesterday without scotoma and h/a intermittently 2-3x/wk relieved with Ibuprofen. States has help from friends and her partner and visits baby every 3 days, is pumping with electric breast pump when visits baby and with manual pump when at home q 4 hours. States baby is doing ok and she will get to hold him for the first time in a few days if he is doing ok. Pt teary O:  BP 123/86 on Amlodipine 5 mg daily, wt: 168, P 82. Prevna drain removed by Dr. Alvester Morin. Urine tr protein A:  6 days post LTCS at 26 6/7 fpr severe preeclampsia/HELLP; BP wnl on Amlodipine. Prevena drain removed and incision with 1 cm slightly open but approximated area on left side of incision which is clean and dry without skin erythema P:  Continue with Amlodipine. RTC 2 weeks for BP and incision check. To [facility] today to obtain electric breastpump and pump q 2-3 hours and take milk to baby as often as she would like

## 2020-11-16 ENCOUNTER — Ambulatory Visit: Payer: Self-pay

## 2020-11-16 NOTE — Lactation Note (Signed)
This note was copied from a baby's chart. Lactation Consultation Note  Patient Name: Maureen Johnston [id number] Date: 11/16/2020   Age:37 days  Spoke to mom over the phone, she confirmed that she picked up her DEBP from [facility] and now she's double pumping consistently every 2-3 hours. She's d/c the hand pump, she continues to see slightly increase in her supply.  This family is Spanish speaking and the [facility] telephone called was made in mom's preferred language. No further questions or concerns, she's aware of NICU LC services and will call PRN.  Maternal Data    Feeding    Lactation Tools Discussed/Used    Interventions    Discharge    Consult Status      Maureen Johnston [name] 11/16/2020, 3:15 PM

## 2020-11-18 ENCOUNTER — Ambulatory Visit: Payer: Self-pay

## 2020-11-21 ENCOUNTER — Telehealth (HOSPITAL_COMMUNITY): Payer: Self-pay | Admitting: *Deleted

## 2020-11-21 NOTE — Telephone Encounter (Signed)
Hospital Discharge Follow-Up Call:  Spoke to patient with telephonic interpreter "Erie Noe [phone]".  Patient reports that she is doing well.  She has had a f/u visit with provider and dressing is off and her BP is stable.  She is still taking her BP meds.  EPDS score today was 5 and patient feels accurately reflects how she is doing emotionally.  Baby is in NICU.

## 2020-11-24 ENCOUNTER — Ambulatory Visit: Payer: Self-pay

## 2020-11-27 ENCOUNTER — Ambulatory Visit: Payer: Self-pay

## 2020-11-27 NOTE — Lactation Note (Addendum)
This note was copied from a baby's chart. Lactation Consultation Note  Patient Name: Maureen Johnston [id number] Date: 11/27/2020 Reason for consult: Follow-up assessment;NICU baby;Preterm <34wks;Infant < 6lbs Age:37 wk.o.  Visited with mom of pre-term NICU female, she reports that pumping is going well but that sometimes she's skipping a pumping session through the night.   Explained to mom the importance of consistent pumping to protect her supply. LC got her extra breastmilk storage bottles to take home. Baby currently receiving trophic feedings and slowly increasing his PO volumes every 12 hours.  Plan of care:   Encouraged mom to start pumping consistently every 2-3 hours, at least 8 times/24 hours She'll power pump in the AM whenever she misses her pumping session at night   FOB present. Parents reported all questions and concerns were answered, they're both aware of LC OP services and will call PRN.  Maternal Data   Mom's supply is WNL   Feeding    Lactation Tools Discussed/Used Tools: Pump;Flanges Flange Size: 24 Breast pump type: Double-Electric Breast Pump Pump Education: Setup, frequency, and cleaning;Milk Storage Reason for Pumping: pre-term NICU infant Pumping frequency: 6 times/24 hours Pumped volume: 150 mL  Interventions Interventions: Breast feeding basics reviewed;DEBP;Education  Discharge Pump: DEBP;Personal  Consult Status Consult Status: Follow-up Follow-up type: In-patient    Maureen Johnston 11/27/2020, 7:30 PM

## 2020-11-29 ENCOUNTER — Ambulatory Visit: Payer: Self-pay

## 2020-11-30 ENCOUNTER — Other Ambulatory Visit: Payer: Self-pay

## 2020-11-30 ENCOUNTER — Ambulatory Visit: Payer: Self-pay | Admitting: Advanced Practice Midwife

## 2020-11-30 DIAGNOSIS — O162 Unspecified maternal hypertension, second trimester: Secondary | ICD-10-CM

## 2020-11-30 NOTE — Progress Notes (Signed)
35 yo G5P3013 with LTCS 11/09/20 at 26 6/7 wks Here for BP and incision check. See notes from 11/15/20  S: Pt states feels better but hasn't gotten to hold baby yet. States he is gaining weight and has some hair now, weighs 2 lbs. She is pumping with electric breastpump q 3 hours (5 oz) and takes to baby.  Denies h/a, scotoma O: BP 118/79 on Amlodipine 5 mg daily A: 3 wks post LTCS at 26 6/7 for severe preeclampsia/HELLP.      BP wnl on Amlodipine 5 mg daily. Incision well healed P:  Continue Amlodipine 5 mg daily. RTC for pp exam

## 2020-11-30 NOTE — Progress Notes (Signed)
Here today for 3 week PP and BP check up. VS 171.6 lb, T 98.5, BP 118/79, P78. Tawny Hopping, RN

## 2020-12-05 ENCOUNTER — Ambulatory Visit: Payer: Self-pay

## 2020-12-05 NOTE — Lactation Note (Signed)
This note was copied from a baby's chart. Lactation Consultation Note  Patient Name: Boy Mandy Peeks [id number]'[id number] Date: 12/05/2020 Reason for consult: Follow-up assessment;NICU baby;Preterm <34wks;Infant < 6lbs Age:37 wk.o.  Visited with mom of 29 46/4 weeks old (adjusted) NICU female, she's pumping consistently and experiencing an increased in her supply, praised her for her efforts.  Mom voiced that pumping sessions are comfortable and reports no pain/discomfort; she continues using coconut oil prior pumping and she believes is helping her from getting sore.  Provided mom with extra bottles to take home, she also requested extra labels, NICU RN notified. Baby "[name]" continues to increase his volumes through gavage feedings.  Plan of care:  Encouraged mom to continue pumping consistently at least 8 times/24 hours She'll power pump in the AM whenever she misses her pumping session at night  No other support person present at this time. Mom reported all questions and concerns were answered, will call NICU LC PRN.  Maternal Data   Mom's supply has slightly increased, and is WNL  Feeding Mother's Current Feeding Choice: Breast Milk and Donor Milk  Lactation Tools Discussed/Used Tools: Pump;Flanges Flange Size: 24 Breast pump type: Double-Electric Breast Pump Pump Education: Setup, frequency, and cleaning;Milk Storage Reason for Pumping: pre-tern NICU infant Pumping frequency: 7 times/24 hours Pumped volume: 150 mL (150-180 ml)  Interventions Interventions: Breast feeding basics reviewed;DEBP;Education  Discharge Pump: DEBP;Personal  Consult Status Consult Status: Follow-up Follow-up type: In-patient    Angala Hilgers [name] 12/05/2020, 7:21 PM

## 2020-12-15 ENCOUNTER — Ambulatory Visit: Payer: Self-pay

## 2020-12-15 ENCOUNTER — Telehealth: Payer: Self-pay

## 2020-12-15 NOTE — Telephone Encounter (Signed)
Client has pp appt scheduled today at [facility] in addition to pp app with IUD placement 12/26/2020 (Dr. Forestine Chute) per Epic appt desk. Counseled client that does not need 2 post-partum appts and [facility] unable to insert IUD today as provider in clinic does not yet do procedure. Per client, she will cancel [facility] appt and keep appt with Dr. Vergie Living. Salli Real interpreted during call. Jossie Ng, RN

## 2020-12-16 ENCOUNTER — Ambulatory Visit: Payer: Self-pay

## 2020-12-16 NOTE — Lactation Note (Signed)
This note was copied from a baby's chart. Lactation Consultation Note  Patient Name: Boy Daija Routson [id number]'[id number] Date: 12/16/2020   Age:37 wk.o.  Attempted to visit with mom but she wasn't in the room, LC made a F/U phone call and mom voiced that she won't be coming in the hospital until later tonight, around 8:30 pm.  She reported pumping is going well and that she's getting different amounts on each pumping session. She's planning on coming ot the hospital tomorrow around 3 pm; LC will F/U with her and will check pumped milk in bottles to have a better picture of her pumping status.   Roseann Kees S Jaque Dacy 12/16/2020, 6:12 PM

## 2020-12-20 ENCOUNTER — Ambulatory Visit: Payer: Self-pay

## 2020-12-22 ENCOUNTER — Ambulatory Visit: Payer: Self-pay

## 2020-12-22 NOTE — Lactation Note (Signed)
This note was copied from a baby's chart. Lactation Consultation Note  Patient Name: Boy Kiyla Ringler [id number]'[id number] Date: 12/22/2020 Reason for consult: Follow-up assessment;NICU baby;Preterm <34wks Age:37 wk.o.  Interpretor: Jesus. Lactation conducted a weekly follow-up visit with [name]. She states that she continues to pump strong volumes. She denies need for lactation supplies.   Lactation Tools Discussed/Used Pumping frequency: q 3 hours (q 3 hours) Pumped volume: 180 mL (3 ounces/breast)  Interventions Interventions: Breast feeding basics reviewed;Education  Discharge Pump: DEBP  Consult Status Consult Status: Follow-up Follow-up type: In-patient    Walker Shadow 12/22/2020, 4:13 PM

## 2020-12-23 ENCOUNTER — Ambulatory Visit: Payer: Self-pay

## 2020-12-23 NOTE — Lactation Note (Signed)
This note was copied from a baby's chart. Lactation Consultation Note  Patient Name: Maureen Johnston GYIRS'W Date: 12/23/2020 Reason for consult: Follow-up assessment;Preterm <34wks;NICU baby;Infant < 6lbs Age:37 wk.o.  Visited with mom of a 58 38/58 weeks old (adjusted) NICU female, she voiced that the NICU is no longer receiving her breastmilk because baby lost too much weight last week (due to an antibiotic) and now he's on Similac 30 calorie formula; to promote weight gain.  Per mom, she was told to start freezing her milk at home and she'll bring it to the hospital once baby is no longer on 30 cal formula. She continues pumping consistently and getting great volumes, praised her for her efforts. LC provided with extra bottles and labels to take home.  Plan of care:   Encouraged mom to continue pumping consistently at least 8 times/24 hours She'll power pump in the AM whenever she misses her pumping session at night   GOB (paternal) present. Family reported all questions and concerns were answered, they're all aware of NICU LC and will call PRN.  Maternal Data   Mom's milk supply is WNL-ABL  Feeding Mother's Current Feeding Choice: Breast Milk and Formula  Lactation Tools Discussed/Used Tools: Pump;Flanges Flange Size: 24 Breast pump type: Double-Electric Breast Pump Pump Education: Setup, frequency, and cleaning;Milk Storage Reason for Pumping: pre-term NICU infant Pumping frequency: 7 times/24 hours (miss her pumping session at night, but power pumps the next morning) Pumped volume: 180 mL (180-210)  Interventions Interventions: DEBP;Breast feeding basics reviewed;Education  Discharge Pump: DEBP  Consult Status Consult Status: Follow-up Follow-up type: In-patient    Josedaniel Haye [name] 12/23/2020, 6:49 PM

## 2020-12-26 ENCOUNTER — Ambulatory Visit: Payer: Self-pay | Admitting: Obstetrics and Gynecology

## 2020-12-27 ENCOUNTER — Telehealth: Payer: Self-pay

## 2020-12-27 NOTE — Telephone Encounter (Signed)
Per M. Tacey Heap in clerical, patient walked in this morning stating she missed her PP appointment in [place] yesterday and was unable to contact their office to reschedule. Patient told that RN would check into the issue and call her. TC to [facility] in [place] and patient appointment rescheduled for 01/04/2021, arrival time 9:55am. TC to patient and gave appointment date and time. Per patient, she went to the [facility] hospital yesterday thinking that's where her appointment was. Patient given address and phone number for [facility]. Patient wrote the information down and read it back correctly. Patient counseled to call the number given to reschedule if needed, and to call [facility] if she has problems getting in touch with [facility]. Patient states understanding and denies further questions at this time. Interpreter, Juliene Pina.Burt Knack, RN

## 2020-12-30 ENCOUNTER — Ambulatory Visit: Payer: Self-pay

## 2020-12-30 NOTE — Lactation Note (Signed)
This note was copied from a baby's chart. Lactation Consultation Note  Patient Name: Maureen Johnston KZLDJ'T Date: 12/30/2020 Reason for consult: Follow-up assessment;NICU baby;Infant < 6lbs;Late-preterm 34-36.6wks Age:37 wk.o.  Visited with mom of 37 54/60 weeks old (adjusted) NICU female, she wonders how her baby would take the breast when he's ready, because he can suck "really hard" on the paci.  Provided mom reassurance that she can continue doing STS with baby, but if she decides to take him to breast, it will have to be at the emptied/pumped breast just for some "BF practice" only if baby is ready.  Reviewed pumping schedule, readiness cues and benefits of STS care.  Plan of care:   Encouraged mom to pump consistently at least 8 times/24 hours She'll power pump in the AM whenever she misses her pumping session at night She'll start doing some STS with baby and let him nuzzle at the empty breast if showing feeding cues   No other support person at this time. All questions and concerns answered, mom to call NICU LC PRN.  Maternal Data   Mom's milk supply is WNL; although she's pumping infrequently  Feeding Mother's Current Feeding Choice: Breast Milk and Formula  Lactation Tools Discussed/Used Tools: Flanges;Pump Flange Size: 24 Breast pump type: Double-Electric Breast Pump Pump Education: Setup, frequency, and cleaning;Milk Storage Reason for Pumping: LPI in NICU Pumping frequency: 4 times/24 hours Pumped volume: 180 mL (180-210 ml)  Interventions Interventions: Breast feeding basics reviewed;DEBP;Education  Discharge Pump: DEBP  Consult Status Consult Status: Follow-up Date: 12/30/20 Follow-up type: In-patient   Mahkai Fangman [name] 12/30/2020, 12:19 PM

## 2020-12-30 NOTE — Lactation Note (Signed)
This note was copied from a baby's chart. Lactation Consultation Note  Patient Name: Boy Ita Fritzsche JDYNX'[id number] Date: 12/30/2020   Age:37 wk.o.  Attempted to visit with mom but she wasn't in the room, NICU RN was unsure if she was going to come during the daytime today. LC spoke with mom over the phone and she voiced she's planning to come around noon.  She also said she's been doing some STS with baby but not daily. Reviewed feeding cues and IDF, mom was advised to put baby to empty/pumped breast for some STS time and if baby cues, she can also attempt latching.   Mom aware to call NICU LC when she comes to the hospital to discuss baby's feeding plan.  Maternal Data   150-180 ml x 3-4 times/24 hours  Nichael Ehly S Saifan Rayford 12/30/2020, 9:00 AM

## 2021-01-04 ENCOUNTER — Other Ambulatory Visit: Payer: Self-pay

## 2021-01-04 ENCOUNTER — Encounter: Payer: Self-pay | Admitting: Nurse Practitioner

## 2021-01-04 ENCOUNTER — Ambulatory Visit (INDEPENDENT_AMBULATORY_CARE_PROVIDER_SITE_OTHER): Payer: Self-pay | Admitting: Nurse Practitioner

## 2021-01-04 DIAGNOSIS — Z8759 Personal history of other complications of pregnancy, childbirth and the puerperium: Secondary | ICD-10-CM

## 2021-01-04 DIAGNOSIS — Z6834 Body mass index (BMI) 34.0-34.9, adult: Secondary | ICD-10-CM

## 2021-01-04 DIAGNOSIS — O162 Unspecified maternal hypertension, second trimester: Secondary | ICD-10-CM

## 2021-01-04 DIAGNOSIS — Z30013 Encounter for initial prescription of injectable contraceptive: Secondary | ICD-10-CM

## 2021-01-04 DIAGNOSIS — Z3202 Encounter for pregnancy test, result negative: Secondary | ICD-10-CM

## 2021-01-04 MED ORDER — AMLODIPINE BESYLATE 5 MG PO TABS: 5.0000 mg | ORAL_TABLET | Freq: Every day | ORAL | 1 refills | Status: AC

## 2021-01-04 MED ORDER — MEDROXYPROGESTERONE ACETATE 150 MG/ML IM SUSP
150.0000 mg | Freq: Once | INTRAMUSCULAR | Status: AC
  Administered 2021-01-04: 150 mg via INTRAMUSCULAR

## 2021-01-04 NOTE — Progress Notes (Signed)
Post Partum Visit Note  Maureen Johnston is a 35 y.o. [id number] female who presents for a postpartum visit. She is 8 weeks postpartum following an urgent primary cesarean section.  I have fully reviewed the prenatal and intrapartum course. The delivery was at 26.6 gestational weeks due to severe preeclampsia/HELLP with transamnitis and placental abruption noted at C/S.  Had MGSO4 given postpartum.  Anesthesia: spinal. Postpartum course has been good. Pecola Leisure is doing well and anticipates going home from NICU in mid October. Baby is feeding by breast. Bleeding no bleeding. Bowel function is normal. Bladder function is normal. Patient is not sexually active. Contraception method is IUD. Postpartum depression screening: negative. In person interpreter present for the entire visit.  Pt states has not taken any BP med for 2 weeks. She ran out of meds.   The pregnancy intention screening data noted above was reviewed. Potential methods of contraception were discussed. The patient elected to proceed with No data recorded.   Edinburgh Postnatal Depression Scale - 01/04/21 1031       Edinburgh Postnatal Depression Scale:  In the Past 7 Days   I have been able to laugh and see the funny side of things. 0    I have looked forward with enjoyment to things. 0    I have blamed myself unnecessarily when things went wrong. 0    I have been anxious or worried for no good reason. 0    I have felt scared or panicky for no good reason. 0    Things have been getting on top of me. 1    I have been so unhappy that I have had difficulty sleeping. 0    I have felt sad or miserable. 0    I have been so unhappy that I have been crying. 0    The thought of harming myself has occurred to me. 0    Edinburgh Postnatal Depression Scale Total 1             Health Maintenance Due  Topic Date Due   HIV Screening  Never done   TETANUS/TDAP  Never done   COVID-19 Vaccine (3 - Booster for Pfizer series) 05/30/2020    INFLUENZA VACCINE  11/28/2020    The following portions of the patient's history were reviewed and updated as appropriate: allergies, current medications, past family history, past medical history, past social history, past surgical history, and problem list.  Review of Systems Pertinent items noted in HPI and remainder of comprehensive ROS otherwise negative.  Objective:  BP (!) 119/95   Pulse 70   Ht 4\' 11"  (1.499 m)   Wt 171 lb 1.6 oz (77.6 kg)   LMP 12/15/2020 (Exact Date)   Breastfeeding Yes   BMI 34.56 kg/m    General:  alert, cooperative, and no distress   Breasts:  not indicated  Lungs: clear to auscultation bilaterally  Heart:  regular rate and rhythm, S1, S2 normal, no murmur, click, rub or gallop  Abdomen: Soft nontender    Wound Well healed C/S scar  GU exam:  not indicated       Assessment:    There are no diagnoses linked to this encounter.    Postpartum exam with elevated BP not on medication.   Plan:   Essential components of care per ACOG recommendations:  1.  Mood and well being: Patient with negative depression screening today. Reviewed local resources for support.  - Patient tobacco use? No.   - hx  of drug use? No.    2. Infant care and feeding:  -Patient currently breastmilk feeding? Yes. Reviewed importance of draining breast regularly to support lactation.  -Social determinants of health (SDOH) reviewed in EPIC. No concerns  3. Sexuality, contraception and birth spacing - Patient does not want a pregnancy in the next year.  Desired family size is unsure number of children.  - Reviewed forms of contraception in tiered fashion. Patient desired IUD today.  Was unaware she would be charged as she has no insurance.  Discussed options.  She went to [facility] but stated they do not insert IUDs.  Will fill out application to get free IUD here - took paperwork today and will bring in documents required.  Will give Depo today and advised  no intercourse for 2 weeks. - Discussed birth spacing of 18 months  4. Sleep and fatigue -Encouraged family/partner/community support of 4 hrs of uninterrupted sleep to help with mood and fatigue  5. Physical Recovery  - Discussed patients delivery and complications.  - Patient had a C-section. - Patient has urinary incontinence? No. - Patient is not safe to resume physical and sexual activity for 2 weeks  6.  Health Maintenance - HM due items addressed Yes - Last pap smear 07-15-20 normal with no HRHPV.   Pap smear not done at today's visit.  -Breast Cancer screening indicated? No.   7. Chronic Disease/Pregnancy Condition follow up: Hypertension Will prescribe more of the BP medication - should continue to take as BP not in normal range today and see PCP - referred to [facility]  - PCP follow up  Currie Paris, NP Center for [employer], [facility]

## 2021-01-05 LAB — POCT PREGNANCY, URINE: Preg Test, Ur: NEGATIVE

## 2021-01-07 ENCOUNTER — Ambulatory Visit: Payer: Self-pay

## 2021-01-07 NOTE — Lactation Note (Signed)
This note was copied from a baby's chart. Lactation Consultation Note  Patient Name: [name] Date: 01/07/2021   Age:37 wk.o.  Attempted to visit with mom but she wasn't in the room, LC called mom and she said she might not be coming this weekend due to the weather.  Mom provided updates over the phone regarding her pumping status, she also reported that she got her period for the first time after baby's birth on 12/15/20 but that it hasn't affected her supply.  Baby is now at 35 2/7 weeks (adjusted) and mom is interested in doing some breast practice next week. Scheduled an appt for 09/13 at 11 am, mom will let us know if anything changes; but so far she'd like to start taking baby to breast, she voiced he's been showing some feeding cues.  Continue current plan of care, NICU LC will F/U on 09/13 for feeding assist.   Maternal Data   Mom's milk supply is WNL, at 120-180 ml per pumping session. She's pumping 7-8 times/24 hours.  [name] S Ameshia Pewitt 01/07/2021, 3:46 PM

## 2021-01-08 ENCOUNTER — Ambulatory Visit: Payer: Self-pay

## 2021-01-08 NOTE — Lactation Note (Signed)
This note was copied from a baby's chart. Lactation Consultation Note  Patient Name: Maureen Johnston MLVXB'O Date: 01/08/2021 Reason for consult: Follow-up assessment;NICU baby;Infant < 6lbs;Preterm <34wks Age:37 wk.o.  Visited with mom of 65 5/61 weeks old (adjusted) NICU female, she came to the hospital to visit baby but forgot her pump kit. Provided mom with another pump kit for hospital use, since the one she got during her hospital stay is at home [facility] didn't provide a pump kit for her).  Baby sound asleep when entered the room, but mom voiced he's been cueing every so often. Encouraged mom to start doing some breast practice at the empty/pumped breast if baby is showing readiness.  Plan of care:   Encouraged mom to pump consistently at least 8 times/24 hours She'll power pump in the AM whenever she misses her pumping session at night She'll start taking baby to a pumped/empty breast if showing feeding cues   Family member present. All questions and concerns answered, mom to call NICU LC PRN.  Maternal Data   Mom's supply is WNL   Feeding Mother's Current Feeding Choice: Breast Milk and Formula  Lactation Tools Discussed/Used Tools: Pump;Flanges Flange Size: 24 Breast pump type: Double-Electric Breast Pump Pump Education: Setup, frequency, and cleaning;Milk Storage Reason for Pumping: LPI in NICU Pumping frequency: 6-7 times/24 hours Pumped volume: 180 mL (180-210 ml)  Interventions Interventions: Breast feeding basics reviewed;DEBP;Education  Discharge Pump: DEBP  Consult Status Consult Status: Follow-up Date: 01/08/21 Follow-up type: In-patient  Noele Icenhour Francene Boyers 01/08/2021, 6:27 PM

## 2021-01-10 ENCOUNTER — Ambulatory Visit: Payer: Self-pay

## 2021-01-10 NOTE — Lactation Note (Signed)
This note was copied from a baby's chart. Lactation Consultation Note  Patient Name: Maureen Johnston [id number] Date: 01/10/2021 Reason for consult: Follow-up assessment;NICU baby;Infant < 6lbs;Late-preterm 34-36.6wks Age:37 m.o.  LC and LC student [place] visited with mom of 27 78/40 weeks old (adjusted) NICU female, she requested a feeding assist at 11 am but didn't come to the hospital till 11:25 am.   Mom pumped both breast prior this feeding, LC assisted mom with hand expression and finger feeding prior latching but "Maureen Johnston" wasn't showing any feeding cues, but he'd react to stimuli and woke up when removing his clothes.  LC took baby STS to mother's left breast in cross cradle hold, baby latched briefly with a few sucks, on and off and then fell asleep. Explained to mom that this is normal LPI behavior, reviewed expectations.  Plan of care:   Encouraged mom to pump consistently at least 8 times/24 hours She'll power pump in the AM whenever she misses her pumping session at night She'll start taking baby to a pumped/empty breast if showing feeding cues; once/day   No other support person present at this time. All questions and concerns answered, mom to call NICU LC PRN.  Feeding Mother's Current Feeding Choice: Breast Milk and Formula  LATCH Score Latch: Repeated attempts needed to sustain latch, nipple held in mouth throughout feeding, stimulation needed to elicit sucking reflex.  Audible Swallowing: None  Type of Nipple: Everted at rest and after stimulation  Comfort (Breast/Nipple): Soft / non-tender  Hold (Positioning): Assistance needed to correctly position infant at breast and maintain latch.  LATCH Score: 6   Lactation Tools Discussed/Used    Interventions Interventions: Breast feeding basics reviewed;Skin to skin;Breast massage;Hand express;Assisted with latch;Adjust position;Support pillows;Breast compression;DEBP  Discharge Pump: DEBP  Consult  Status Consult Status: Follow-up Date: 01/11/21 Follow-up type: In-patient  Maureen Johnston Maureen Johnston 01/10/2021, 1:14 PM

## 2021-01-20 ENCOUNTER — Ambulatory Visit: Payer: Self-pay

## 2021-01-20 NOTE — Lactation Note (Signed)
This note was copied from a baby's chart. Lactation Consultation Note  Patient Name: Maureen Johnston CFPFB'[id number] Date: 01/20/2021 Reason for consult: Follow-up assessment;NICU baby;Infant < 6lbs;Early term 37-38.6wks Age:37 m.o.  Visited with mom of 97 42/34 weeks old (adjusted) NICU female, she reports that pumping is still going well and that she continues to power pump in the morning whenever she misses a pumping session during the night.  She has been taking baby "Maureen Johnston" to breast and feels like "he's getting something", last LATCH score of 10, but need to re-assess. LC provided mom with 8 oz. bottles and they are compatible [facility] hospital pump kit.   Plan of care:   Encouraged mom to pump consistently at least 8 times/24 hours She'll continue power pumping in the AM whenever she misses her pumping session at night She'll start taking baby to a pumped/empty breast if showing feeding cues; once/day   No other support person present at this time. All questions and concerns answered, mom to call NICU LC PRN this weekend for feeding assist.   Maternal Data   Mom's supply is still WNL  Feeding Mother's Current Feeding Choice: Breast Milk and Formula  Lactation Tools Discussed/Used Tools: Pump;Flanges Flange Size: 24 Breast pump type: Double-Electric Breast Pump Pump Education: Setup, frequency, and cleaning;Milk Storage Reason for Pumping: ETI in NICU Pumping frequency: 6 times/24 hours Pumped volume: 180 mL (180-210 ml)  Interventions Interventions: Breast feeding basics reviewed;DEBP;Education  Discharge Pump: DEBP  Consult Status Consult Status: Follow-up Date: 01/20/21 Follow-up type: In-patient  Rasheed Welty Francene Boyers 01/20/2021, 8:26 PM

## 2021-01-24 ENCOUNTER — Ambulatory Visit: Payer: Self-pay

## 2021-01-24 NOTE — Lactation Note (Signed)
This note was copied from a baby's chart. Lactation Consultation Note  Patient Name: Boy Naeemah Jasmer FBPZW'C Date: 01/24/2021 Reason for consult: Follow-up assessment;NICU baby;Early term 37-38.6wks Age:37 m.o.  Visited with mom of 23 43/64 weeks old (adjusted) ETI NICU female, mom came to visit straight from running some errands and she told LC she had not pumped yet; baby had a 5 pm feeding.   Explained to mom that in order to put baby to breast, her breasts had to be pumped/emptied since SLP has not cleared baby for PO feedings yet. Mom said she'll be back tomorrow, an appt for tomorrow was scheduled at noon.  Made NICU RN aware to start documenting LATCH scores when baby is going to breast, per mom she's been taking him to breast every time she comes to visit, but last LATCH score documented was a week ago.  Plan of care:   Encouraged mom to pump consistently at least 8 times/24 hours She'll continue power pumping in the AM whenever she misses her pumping session at night She'll continue taking baby to a pumped/empty breast whenever he's showing feeding cues; at least once/day   No other support person present at this time. All questions and concerns answered, mom to call NICU LC PRN this weekend for feeding assist.   Maternal Data   Mom's supply is WNL  Feeding Mother's Current Feeding Choice: Breast Milk and Formula  Lactation Tools Discussed/Used Tools: Pump;Flanges Flange Size: 24 Breast pump type: Double-Electric Breast Pump Pump Education: Setup, frequency, and cleaning;Milk Storage Reason for Pumping: ETI in NICU Pumping frequency: 6 times-24 hours Pumped volume: 180 mL (180-210 ml)  Interventions Interventions: Breast feeding basics reviewed;DEBP;Education  Consult Status Consult Status: Follow-up Date: 01/24/21 Follow-up type: In-patient  Ronin Crager [name] 01/24/2021, 4:48 PM

## 2021-01-25 ENCOUNTER — Ambulatory Visit: Payer: Self-pay

## 2021-01-25 NOTE — Lactation Note (Signed)
This note was copied from a baby's chart.  Lactation Consultation Note  Patient Name: Maureen Johnston [id number] Date: 01/25/2021 Age:37 m.o.  Subjective Reason for consult: Follow-up assessment Mother is able to latch independently and reports strong tug without pain or pinching. She continues to pre-pump, per SLP recommendation.   Objective Infant data: Mother's Current Feeding Choice: Breast Milk  Maternal data: [id number]  C-Section, Low Transverse  Pumping frequency: 6 times-24 hours Pumped volume: 150 mL  Flange Size: 24  Assessment Baby bf effectively with audible swallows. Mother has abundant milk supply  Intervention/Plan Interventions: Assisted with latch; Skin to skin; Adjust position  Plan: Watch for signs of hunger / inadequate gain while pre-pumping and following IDF-2 Consult Status: Follow-up  Elder Negus 01/25/2021, 5:08 PM

## 2021-02-04 ENCOUNTER — Ambulatory Visit: Payer: Self-pay

## 2021-02-04 NOTE — Lactation Note (Signed)
This note was copied from a baby's chart. Lactation Consultation Note  Patient Name: Maureen Johnston IRWER'X Date: 02/04/2021 Reason for consult: Weekly NICU follow-up;NICU baby;Breastfeeding assistance;Term Age:37 m.o.  Visited with mom of 52 51/26 weeks old (adjusted) NICU female; she requested a feeding assist for baby "[name]". LC assisted with hand expression and latching, baby latched with ease at first and sustain the latch for the first 5 minutes with rhythmical sucking.  Shortly after, he moved and latched became shallow, LC broke the latch to reposition baby but he would not relatch again at the breast and starting falling asleep. Mom voiced that baby hasn't been taking the breast like he used to since the introduction of the bottle. LC suggested to use a NS the next time we have a feeding assist.  Plan of care:   Encouraged mom to pump consistently at least 8 times/24 hours She'll continue power pumping in the AM whenever she misses her pumping session at night She'll continue taking baby to a full breast whenever he's showing feeding cues   No other support person present at this time. All questions and concerns answered, mom to call NICU LC PRN.  Maternal Data   Mom's supply is WNL  Feeding Mother's Current Feeding Choice: Breast Milk and Formula  LATCH Score Latch: Repeated attempts needed to sustain latch, nipple held in mouth throughout feeding, stimulation needed to elicit sucking reflex. (baby would not re-latched after the 5 minutes mark)  Audible Swallowing: Spontaneous and intermittent (short bursts of sucking)  Type of Nipple: Everted at rest and after stimulation  Comfort (Breast/Nipple): Soft / non-tender  Hold (Positioning): Assistance needed to correctly position infant at breast and maintain latch.  LATCH Score: 8  Lactation Tools Discussed/Used Tools: Pump;Flanges Flange Size: 24 Breast pump type: Double-Electric Breast Pump Pump Education:  Setup, frequency, and cleaning;Milk Storage Reason for Pumping: NICU stay Pumping frequency: 6 times/24 hours Pumped volume: 210 mL  Interventions Interventions: Assisted with latch;Skin to skin;Breast massage;Hand express;Adjust position;Support pillows;DEBP;Education  Discharge Pump: DEBP  Consult Status Consult Status: Follow-up Date: 02/04/21 Follow-up type: In-patient   Chrishana Spargur [name] 02/04/2021, 5:22 PM

## 2021-02-09 ENCOUNTER — Ambulatory Visit: Payer: Self-pay

## 2021-02-09 NOTE — Lactation Note (Signed)
This note was copied from a baby's chart. Lactation Consultation Note  Patient Name: Maureen Johnston UPJSR'P Date: 02/09/2021 Reason for consult: Follow-up assessment;NICU baby;Preterm <34wks Age:37 m.o.  In house interpretor: Mili. Lactation followed up with [name]. We discussed the IDF protocol, and I explained to her how to log her minutes of breast feeding. I provided a NICU booklet in Spanish and showed her where she can log her feeding times. I encouraged her to log the start and end time each time she puts baby to breast (per RN, [name] could use reinforcement of IDF education).  Mother is typically here between 1600-2200 and feeds baby then. Per SLP, [name], there are no barriers to breast feeding. Mother could not stay longer today, but we made an appointment for breast feeding assistance for Friday 10/14 at 1700. She is latching baby, but the feedings are brief (5 minutes), and she would benefit from some hands-on support.   I followed up with RN, Elmarie Shiley, after the visit. All questions answered at this time.    Feeding Mother's Current Feeding Choice: Breast Milk and Formula Nipple Type: Dr. Levert Feinstein Preemie  Lactation Tools Discussed/Used Breast pump type: Double-Electric Breast Pump Pump Education: Setup, frequency, and cleaning Reason for Pumping: NICU Pumping frequency: 6 times a day Pumped volume: 130 mL (4-5 ounces/session)  Interventions Interventions: Breast feeding basics reviewed;Education;Infant Driven Feeding Algorithm education;LC Services brochure;"The NICU and Your Baby" book  Discharge Pump: DEBP  Consult Status Consult Status: Follow-up Date: 02/09/21 Follow-up type: In-patient    Walker Shadow 02/09/2021, 10:16 AM

## 2021-02-11 ENCOUNTER — Ambulatory Visit: Payer: Self-pay

## 2021-02-11 NOTE — Lactation Note (Signed)
This note was copied from a baby's chart. Lactation Consultation Note  Patient Name: Boy Tannah Dreyfuss [id number] Date: 02/11/2021   Age:37 m.o.  Attempted to visit with mom, but she wasn't in the room, asked RN Tiffany regarding 5 pm feeding assist that "Bettey Mare" was supposed to have yesterday with lactation, but she reported that mom didn't come in till 8 pm last night.  This LC called mom and she said she had to stay overnight because "[facility]" wasn't feeling well, he had diarrhea but she still has been taking him to breast, last LATCH score documented this morning was 8.  Mom aware of NICU LC hours during this weekend to re-schedule feeding assist. She also voiced that she's been using the feeding log that Herbert Seta gave her to document all feedings/attempts at the breast, praised her for her efforts.  Continue current plant of care, mom to call NICU LC PRN.  Feeding Nipple Type: Dr. Levert Feinstein Preemie   Mattison Stuckey S Augusta Mirkin 02/11/2021, 5:19 PM

## 2021-02-15 ENCOUNTER — Ambulatory Visit: Payer: Self-pay

## 2021-02-15 NOTE — Lactation Note (Signed)
This note was copied from a baby's chart. Lactation Consultation Note  Patient Name: Maureen Johnston [id number] Date: 02/15/2021 Reason for consult: Follow-up assessment;NICU baby;Term;Breastfeeding assistance Age:37 m.o.  Visited with mom of 37 46/15 weeks old (adjusted) NICU female, she was getting ready to latch baby to breast. LC assisted with latch, "Maureen Johnston" latches with ease, but fatigues quickly, he would still switch to NNS from time to time, although he also did transfer a bolus, a few audible swallows noted during this feeding.  Mom was worried about her supply, she voiced that someone told her to stop pumping whenever she takes baby Maureen Johnston to breast. Mom hasn't been pumping after feedings at the breast for the last week. Explained to mom the importance of continuing pumping after feedings to protect her supply, she voiced understanding.  Plan of care:   Encouraged mom to start pumping again consistently at least 8 times/24 hours, even after feedings at the breast She'll continue taking baby to a full breast whenever he's showing feeding cues, he's currently ad lib, mom is also supplementing with bottles   No other support person present at this time. All questions and concerns answered, mom to call NICU LC PRN.   Maternal Data   Mom's milk supply has greatly decreased, probably due to infrequent pumping. She voiced she's only been pumping at home because she's been putting baby to breast while at the hospital.  Feeding Mother's Current Feeding Choice: Breast Milk and Formula Nipple Type: Dr. Lorne Skeens  LATCH Score Latch: Grasps breast easily, tongue down, lips flanged, rhythmical sucking.  Audible Swallowing: A few with stimulation (with compressions)  Type of Nipple: Everted at rest and after stimulation  Comfort (Breast/Nipple): Soft / non-tender  Hold (Positioning): No assistance needed to correctly position infant at breast. (minimal assistance needed)  LATCH  Score: 9   Lactation Tools Discussed/Used Tools: Pump;Flanges Flange Size: 24 Breast pump type: Double-Electric Breast Pump Pump Education: Setup, frequency, and cleaning;Milk Storage Reason for Pumping: NICU stay Pumping frequency: 2 times/24 hours Pumped volume: 120 mL (120-150 ml)  Interventions Interventions: Assisted with latch;Breast compression;Adjust position;Support pillows;DEBP;Education  Discharge Pump: DEBP  Consult Status Consult Status: Follow-up Date: 02/15/21 Follow-up type: In-patient   Maureen Johnston Maureen Johnston 02/15/2021, 11:38 AM

## 2021-03-15 ENCOUNTER — Ambulatory Visit: Payer: Self-pay

## 2021-03-28 ENCOUNTER — Ambulatory Visit: Payer: Self-pay

## 2021-04-11 ENCOUNTER — Other Ambulatory Visit: Payer: Self-pay

## 2021-04-11 ENCOUNTER — Ambulatory Visit: Payer: Self-pay | Admitting: Advanced Practice Midwife

## 2021-04-11 ENCOUNTER — Ambulatory Visit (LOCAL_COMMUNITY_HEALTH_CENTER): Payer: Self-pay | Admitting: Advanced Practice Midwife

## 2021-04-11 ENCOUNTER — Encounter: Payer: Self-pay | Admitting: Advanced Practice Midwife

## 2021-04-11 VITALS — BP 136/86 | Ht 59.0 in | Wt 172.0 lb

## 2021-04-11 DIAGNOSIS — Z30017 Encounter for initial prescription of implantable subdermal contraceptive: Secondary | ICD-10-CM

## 2021-04-11 DIAGNOSIS — Z3009 Encounter for other general counseling and advice on contraception: Secondary | ICD-10-CM

## 2021-04-11 MED ORDER — ETONOGESTREL 68 MG ~~LOC~~ IMPL
68.0000 mg | DRUG_IMPLANT | Freq: Once | SUBCUTANEOUS | Status: AC
Start: 2021-04-11 — End: 2021-04-11
  Administered 2021-04-11: 68 mg via SUBCUTANEOUS

## 2021-04-11 NOTE — Progress Notes (Signed)
[facility] [address] Main Number: [phone]  Contraception/Family Planning VISIT ENCOUNTER NOTE  Subjective:   Maureen Johnston is a 35 y.o. SHF nonsmoker [id number] (22, 17, 7, 4 mo) female here for reproductive life counseling and Nexplanon insertion. She had an emergency c/s on 11/09/20 at 26.6 wks for severe preeclampsia; baby is breastfeeding, doing well, and weighs 11 lbs. Now. All her children live in [place] except her baby. Has been on DMPA which she received at her pp visit on 01/04/21 (13 6/7 wks). LMP 02/19/21. At her pp exam she was on antihypertensive meds x2 and ran out.  The patient is currently using Hormonal Injection to prevent pregnancy.  Desires Nexplanon insertion for BCM.  The patient does not want a pregnancy in the next year.  Last pap 06/2020 neg HPV neg. Last sex 03/04/21 with condom; with current partner x 4 years   Denies abnormal vaginal bleeding, discharge, pelvic pain, problems with intercourse or other gynecologic concerns.    Gynecologic History Patient's last menstrual period was 02/19/2021 (approximate).  Health Maintenance Due  Topic Date Due   HIV Screening  Never done   TETANUS/TDAP  Never done   COVID-19 Vaccine (3 - Booster for Pfizer series) 02/23/2020   INFLUENZA VACCINE  11/28/2020     The following portions of the patient's history were reviewed and updated as appropriate: allergies, current medications, past family history, past medical history, past social history, past surgical history and problem list.  Review of Systems Pertinent items are noted in HPI.   Objective:  BP 136/86    Wt 172 lb (78 kg)    LMP 02/19/2021 (Approximate)    Breastfeeding Yes    BMI 34.74 kg/m  Gen: well appearing, NAD HEENT: no scleral icterus CV: RR Lung: Normal WOB Ext: warm well perfused    Assessment and Plan:   Contraception counseling: Reviewed all forms of birth control options in the  tiered based approach. available including abstinence; over the counter/barrier methods; hormonal contraceptive medication including pill, patch, ring, injection,contraceptive implant, ECP; hormonal and nonhormonal IUDs; permanent sterilization options including vasectomy and the various tubal sterilization modalities. Risks, benefits, and typical effectiveness rates were reviewed.  Questions were answered.  Written information was also given to the patient to review.  Patient desires Nexplanon insertion, this was prescribed for patient. She will follow up in  prn for surveillance.  She was told to call with any further questions, or with any concerns about this method of contraception.  Emphasized use of condoms 100% of the time for STI prevention.  Patient was not  offered ECP due to not meeting criteria . ECP was not accepted by the patient. ECP counseling was not given - see RN documentation  1. Family planning Covered with DMPA currently and wants Nexplanon insertion Referred back to [facility] MD to ask if she needs to continue with BP meds; 136/86 today  2. Encounter for initial prescription of implantable subdermal contraceptive Nexplanon Insertion Procedure Patient identified, informed consent performed, consent signed.   Patient does understand that irregular bleeding is a very common side effect of this medication. She was advised to have backup contraception after placement. Patient was determined to meet WHO criteria for not being pregnant. Appropriate time out taken.  The insertion site was identified 8-10 cm (3-4 inches) from the medial epicondyle of the humerus and 3-5 cm (1.25-2 inches) posterior to (below) the sulcus (groove) between the biceps and triceps muscles of  the patient's left arm and marked.  Patient was prepped with alcohol swab and then injected with 3 ml of 1% lidocaine.  Arm was prepped with chlorhexidene, Nexplanon removed from packaging,  Device confirmed in needle, then  inserted full length of needle and withdrawn per handbook instructions. Nexplanon was able to palpated in the patient's arm; patient palpated the insert herself. There was minimal blood loss.  Patient insertion site covered with guaze and a pressure bandage to reduce any bruising.  The patient tolerated the procedure well and was given post procedure instructions.   Nexplanon:   Counseled patient to take OTC analgesic starting as soon as lidocaine starts to wear off and take regularly for at least 48 hr to decrease discomfort.  Specifically to take with food or milk to decrease stomach upset and for IB 600 mg (3 tablets) every 6 hrs; IB 800 mg (4 tablets) every 8 hrs; or Aleve 2 tablets every 12 hrs.      Please refer to After Visit Summary for other counseling recommendations.   Return if symptoms worsen or fail to improve, for Nexplanon insertion.  Alberteen Spindle, CNM [facility] DEPARTMENT

## 2021-04-11 NOTE — Progress Notes (Signed)
See encounter with same date for notes

## 2021-04-11 NOTE — Progress Notes (Signed)
See notes in other appt document. Burt Knack, RN

## 2021-04-11 NOTE — Progress Notes (Signed)
Patient here for PP nexplanon placement. She had her PP exam at [facility] on 01/04/2021 and received Depo at that appointment. Here today for Nexplanon. She had an emergent C-section at 69 6/7 on 11/09/2020. It has been 13 6/7 since her Depo. She was given amlodipine at her PP and has finished that medication. Last Pap 07/15/20, NIL, HPV negative.Burt Knack, RN

## 2023-03-07 ENCOUNTER — Ambulatory Visit: Payer: Self-pay

## 2023-04-01 ENCOUNTER — Ambulatory Visit: Payer: Self-pay

## 2023-04-01 ENCOUNTER — Ambulatory Visit: Payer: Self-pay | Admitting: Family Medicine

## 2023-04-01 VITALS — BP 128/80 | HR 74 | Ht 59.0 in | Wt 178.0 lb

## 2023-04-01 DIAGNOSIS — Z0000 Encounter for general adult medical examination without abnormal findings: Secondary | ICD-10-CM

## 2023-04-01 DIAGNOSIS — Z3046 Encounter for surveillance of implantable subdermal contraceptive: Secondary | ICD-10-CM

## 2023-04-01 DIAGNOSIS — Z3009 Encounter for other general counseling and advice on contraception: Secondary | ICD-10-CM

## 2023-04-01 DIAGNOSIS — Z113 Encounter for screening for infections with a predominantly sexual mode of transmission: Secondary | ICD-10-CM

## 2023-04-01 LAB — WET PREP FOR TRICH, YEAST, CLUE
Trichomonas Exam: NEGATIVE
Yeast Exam: NEGATIVE

## 2023-04-01 LAB — HM HIV SCREENING LAB: HM HIV Screening: NEGATIVE

## 2023-04-01 MED ORDER — NORETHINDRONE 0.35 MG PO TABS: 1.0000 | ORAL_TABLET | Freq: Every day | ORAL | Status: AC

## 2023-04-01 NOTE — Progress Notes (Signed)
[facility] DEPARTMENT [facility] [address] Main Number: [phone]  Family Planning Visit- Repeat Yearly Visit  Subjective:  Maureen Johnston is a 37 y.o. [id number]  being seen today for an annual wellness visit and to discuss contraception options.   The patient is currently using Hormonal Implant for pregnancy prevention. Patient does not want a pregnancy in the next year.    report they are looking for a method that provides Minimal bleeding/improved bleeding profile   Patient has the following medical problems: has BMI 34.0-34.9,adult; History of severe pre-eclampsia; Transaminitis; and Language barrier on their problem list.  Chief Complaint  Patient presents with   Annual Exam    Patient reports to clinic for removal of nexplanon and changing BCM. Reports she has been having HA that are daily- and lead to nausea. Reports she sometimes has an aura of blurred vision and dizziness before she gets a headache. Also reports she has had irregular bleeding x 6 months- strongly desires removal of device.   See flowsheet for other program required questions.   Body mass index is 35.95 kg/m. - Patient is eligible for diabetes screening based on BMI> 25 and age >35?  yes HA1C ordered? yes  Patient reports 1 of partners in last year. Desires STI screening?  Yes   Has patient been screened once for HCV in the past?  No  No results found for: "HCVAB"  Does the patient have current of drug use, have a partner with drug use, and/or has been incarcerated since last result? No  If yes-- Screen for HCV through [facility]   Does the patient meet criteria for HBV testing? No  Criteria:  -Household, sexual or needle sharing contact with HBV -History of drug use -HIV positive -Those with known Hep C   Health Maintenance Due  Topic Date Due   HIV Screening  Never done   DTaP/Tdap/Td (1 - Tdap) Never done   INFLUENZA VACCINE  11/29/2022    COVID-19 Vaccine (3 - 2023-24 season) 12/30/2022    Review of Systems  Constitutional:  Negative for weight loss.  Eyes:  Negative for blurred vision.  Respiratory:  Negative for cough and shortness of breath.   Cardiovascular:  Negative for claudication.  Gastrointestinal:  Negative for nausea.  Genitourinary:  Negative for dysuria and frequency.  Skin:  Negative for rash.  Neurological:  Positive for headaches.  Endo/Heme/Allergies:  Does not bruise/bleed easily.    The following portions of the patient's history were reviewed and updated as appropriate: allergies, current medications, past family history, past medical history, past social history, past surgical history and problem list. Problem list updated.  Objective:   Vitals:   04/01/23 0954  BP: 128/80  Pulse: 74  Weight: 178 lb (80.7 kg)  Height: 4\' 11"  (1.499 m)    Physical Exam Constitutional:      Appearance: Normal appearance.  HENT:     Head: Normocephalic and atraumatic.  Pulmonary:     Effort: Pulmonary effort is normal.  Abdominal:     Palpations: Abdomen is soft.  Musculoskeletal:        General: Normal range of motion.  Skin:    General: Skin is warm and dry.  Neurological:     General: No focal deficit present.     Mental Status: She is alert.  Psychiatric:        Mood and Affect: Mood normal.        Behavior: Behavior normal.  Assessment and Plan:  Maureen Johnston is a 37 y.o. female [phone] presenting to the [facility] Department for an yearly wellness and contraception visit  1. Family planning Contraception counseling: Reviewed options based on patient desire and reproductive life plan. Patient is interested in Oral Contraceptive. This was provided to the patient today.   Risks, benefits, and typical effectiveness rates were reviewed.  Questions were answered.  Written information was also given to the patient to review.    The patient will follow up in  1 years  for surveillance.  The patient was told to call with any further questions, or with any concerns about this method of contraception.  Emphasized use of condoms 100% of the time for STI prevention.  Educated on ECP and assessed need for ECP. Not indicated  - norethindrone (ORTHO MICRONOR) 0.35 MG tablet; Take 1 tablet (0.35 mg total) by mouth daily.  2. Screening for venereal disease  - Chlamydia/Gonorrhea [place] [facility] - HIV [place] [facility] - Syphilis Serology, [place] [facility] - WET PREP FOR TRICH, YEAST, CLUE  3. Well woman exam (no gynecological exam) -A1c check today -last CBE done 06/2020, due in 2025 -pap smear up to date- not due until 2027  4. Encounter for Nexplanon removal Nexplanon Removal Patient identified, informed consent performed, consent signed.   Appropriate time out taken. Nexplanon site identified.  Area prepped in usual sterile fashon. 3 ml of 1% lidocaine with Epinephrine was used to anesthetize the area at the distal end of the implant and along implant site. A small stab incision was made right beside the implant on the distal portion.  The Nexplanon rod was grasped using hemostats/manual and removed without difficulty.  There was minimal blood loss. There were no complications.  Steri-strips were applied over the small incision.  A pressure bandage was applied to reduce any bruising.  The patient tolerated the procedure well and was given post procedure instructions.    Nexplanon:   Counseled patient to take OTC analgesic starting as soon as lidocaine starts to wear off and take regularly for at least 48 hr to decrease discomfort.  Specifically to take with food or milk to decrease stomach upset and for IB 600 mg (3 tablets) every 6 hrs; IB 800 mg (4 tablets) every 8 hrs; or Aleve 2 tablets every 12 hrs.      Return in about 1 year (around 03/31/2024) for annual well-woman exam.  No future appointments. Due to language barrier, interpreter [employer] was  present for this visit.  Lenice Llamas, [place]

## 2023-04-01 NOTE — Progress Notes (Signed)
Pt is here for family planning visit.  Family planning packet reviewed and given to pt.  Wet prep results reviewed, no treatment required per standing orders. Condoms declined. The patient was dispensed micronor x3 today. I provided counseling today regarding the medication. We discussed the medication, the side effects and when to call clinic. Patient given the opportunity to ask questions. Questions answered.  Gaspar Garbe, RN

## 2023-04-02 LAB — HGB A1C W/O EAG: Hgb A1c MFr Bld: 5.6 % (ref 4.8–5.6)

## 2023-08-06 ENCOUNTER — Ambulatory Visit: Payer: Self-pay

## 2023-08-06 VITALS — BP 132/76 | Ht 59.0 in | Wt 184.5 lb

## 2023-08-06 DIAGNOSIS — Z3009 Encounter for other general counseling and advice on contraception: Secondary | ICD-10-CM

## 2023-08-06 DIAGNOSIS — Z30011 Encounter for initial prescription of contraceptive pills: Secondary | ICD-10-CM

## 2023-08-06 MED ORDER — NORETHINDRONE 0.35 MG PO TABS: 1.0000 | ORAL_TABLET | Freq: Every day | ORAL | Status: AC

## 2023-08-06 NOTE — Progress Notes (Signed)
 Patient seen to day in nurse clinic for refill of BCP.  Patient stated feeling better since taking BCP.  No complaints or concerns.  Patient takes pill each day around 8:30 PM.  Patient has not missed any pills.  Patient is on her last pack of BCP.  Verbal order given by Lenice Llamas, FNP for Norethindrone 0.35 mg. # 10 packs (enough to get patient through until PE due).  Norethindrone  0.35 mg. #10 packs dispensed to patient today (RX # [id number]).  Discussed with patient that this was enough BCP until her PE due in 03/2024. I provided counseling today regarding the medication.   Reminder for PE due 04/01/2024 discussed and label to call for appointment applied to BCP. Patient given the opportunity to ask questions. Questions answered.  P[employer] Used # [id number].
